# Patient Record
Sex: Male | Born: 1985
Health system: Southern US, Community
[De-identification: ages and names within clinical notes are randomized; demographics above are authoritative.]

## PROBLEM LIST (undated history)

## (undated) DIAGNOSIS — D649 Anemia, unspecified: Secondary | ICD-10-CM

## (undated) DIAGNOSIS — T7840XA Allergy, unspecified, initial encounter: Secondary | ICD-10-CM

## (undated) DIAGNOSIS — G709 Myoneural disorder, unspecified: Secondary | ICD-10-CM

## (undated) DIAGNOSIS — J45909 Unspecified asthma, uncomplicated: Secondary | ICD-10-CM

## (undated) DIAGNOSIS — K219 Gastro-esophageal reflux disease without esophagitis: Secondary | ICD-10-CM

## (undated) HISTORY — PX: UPPER GASTROINTESTINAL ENDOSCOPY: SHX188

## (undated) HISTORY — PX: COLONOSCOPY: SHX174

## (undated) HISTORY — PX: MYRINGOTOMY: SUR874

## (undated) HISTORY — PX: POLYPECTOMY: SHX149

## (undated) HISTORY — DX: Anemia, unspecified: D64.9

## (undated) HISTORY — DX: Myoneural disorder, unspecified: G70.9

## (undated) HISTORY — DX: Unspecified asthma, uncomplicated: J45.909

## (undated) HISTORY — DX: Gastro-esophageal reflux disease without esophagitis: K21.9

## (undated) HISTORY — DX: Allergy, unspecified, initial encounter: T78.40XA

## (undated) HISTORY — PX: TYMPANOSTOMY TUBE PLACEMENT: SHX32

---

## 2014-05-09 ENCOUNTER — Other Ambulatory Visit (INDEPENDENT_AMBULATORY_CARE_PROVIDER_SITE_OTHER): Payer: Managed Care, Other (non HMO)

## 2014-05-09 ENCOUNTER — Ambulatory Visit (INDEPENDENT_AMBULATORY_CARE_PROVIDER_SITE_OTHER): Payer: Managed Care, Other (non HMO) | Admitting: Internal Medicine

## 2014-05-09 ENCOUNTER — Encounter: Payer: Self-pay | Admitting: Internal Medicine

## 2014-05-09 VITALS — BP 108/64 | HR 77 | Temp 98.4°F | Resp 14 | Ht 69.0 in | Wt 187.6 lb

## 2014-05-09 DIAGNOSIS — Z0000 Encounter for general adult medical examination without abnormal findings: Secondary | ICD-10-CM

## 2014-05-09 DIAGNOSIS — R7989 Other specified abnormal findings of blood chemistry: Secondary | ICD-10-CM

## 2014-05-09 DIAGNOSIS — Z23 Encounter for immunization: Secondary | ICD-10-CM

## 2014-05-09 NOTE — Patient Instructions (Signed)
We have given you the tetanus shot today.   We did not find any abnormalities on exam today and will check your lab work as well. We will call you back with those results.   You can work on squeezing the muscles in the anus and holding for about 20-30 seconds and doing 5 reps about 1-2 times per day to see if this helps. If you start having more problems or are still having the problems please call us back and we can send you to the GI doctor to get their opinion.   Otherwise you are in good health so keep up the good work.   Come back in 1-2 years for a physical. If you have any problems or questions sooner please feel free to call our office.   Health Maintenance A healthy lifestyle and preventative care can promote health and wellness.  Maintain regular health, dental, and eye exams.  Eat a healthy diet. Foods like vegetables, fruits, whole grains, low-fat dairy products, and lean protein foods contain the nutrients you need and are low in calories. Decrease your intake of foods high in solid fats, added sugars, and salt. Get information about a proper diet from your health care provider, if necessary.  Regular physical exercise is one of the most important things you can do for your health. Most adults should get at least 150 minutes of moderate-intensity exercise (any activity that increases your heart rate and causes you to sweat) each week. In addition, most adults need muscle-strengthening exercises on 2 or more days a week.   Maintain a healthy weight. The body mass index (BMI) is a screening tool to identify possible weight problems. It provides an estimate of body fat based on height and weight. Your health care provider can find your BMI and can help you achieve or maintain a healthy weight. For males 20 years and older:  A BMI below 18.5 is considered underweight.  A BMI of 18.5 to 24.9 is normal.  A BMI of 25 to 29.9 is considered overweight.  A BMI of 30 and above is  considered obese.  Maintain normal blood lipids and cholesterol by exercising and minimizing your intake of saturated fat. Eat a balanced diet with plenty of fruits and vegetables. Blood tests for lipids and cholesterol should begin at age 72 and be repeated every 5 years. If your lipid or cholesterol levels are high, you are over age 9, or you are at high risk for heart disease, you may need your cholesterol levels checked more frequently.Ongoing high lipid and cholesterol levels should be treated with medicines if diet and exercise are not working.  If you smoke, find out from your health care provider how to quit. If you do not use tobacco, do not start.  Lung cancer screening is recommended for adults aged 36-80 years who are at high risk for developing lung cancer because of a history of smoking. A yearly low-dose CT scan of the lungs is recommended for people who have at least a 30-pack-year history of smoking and are current smokers or have quit within the past 15 years. A pack year of smoking is smoking an average of 1 pack of cigarettes a day for 1 year (for example, a 30-pack-year history of smoking could mean smoking 1 pack a day for 30 years or 2 packs a day for 15 years). Yearly screening should continue until the smoker has stopped smoking for at least 15 years. Yearly screening should be stopped for  people who develop a health problem that would prevent them from having lung cancer treatment.  If you choose to drink alcohol, do not have more than 2 drinks per day. One drink is considered to be 12 oz (360 mL) of beer, 5 oz (150 mL) of wine, or 1.5 oz (45 mL) of liquor.  Avoid the use of street drugs. Do not share needles with anyone. Ask for help if you need support or instructions about stopping the use of drugs.  High blood pressure causes heart disease and increases the risk of stroke. Blood pressure should be checked at least every 1-2 years. Ongoing high blood pressure should be  treated with medicines if weight loss and exercise are not effective.  If you are 50-31 years old, ask your health care provider if you should take aspirin to prevent heart disease.  Diabetes screening involves taking a blood sample to check your fasting blood sugar level. This should be done once every 3 years after age 61 if you are at a normal weight and without risk factors for diabetes. Testing should be considered at a younger age or be carried out more frequently if you are overweight and have at least 1 risk factor for diabetes.  Colorectal cancer can be detected and often prevented. Most routine colorectal cancer screening begins at the age of 29 and continues through age 58. However, your health care provider may recommend screening at an earlier age if you have risk factors for colon cancer. On a yearly basis, your health care provider may provide home test kits to check for hidden blood in the stool. A small camera at the end of a tube may be used to directly examine the colon (sigmoidoscopy or colonoscopy) to detect the earliest forms of colorectal cancer. Talk to your health care provider about this at age 73 when routine screening begins. A direct exam of the colon should be repeated every 5-10 years through age 80, unless early forms of precancerous polyps or small growths are found.  People who are at an increased risk for hepatitis B should be screened for this virus. You are considered at high risk for hepatitis B if:  You were born in a country where hepatitis B occurs often. Talk with your health care provider about which countries are considered high risk.  Your parents were born in a high-risk country and you have not received a shot to protect against hepatitis B (hepatitis B vaccine).  You have HIV or AIDS.  You use needles to inject street drugs.  You live with, or have sex with, someone who has hepatitis B.  You are a man who has sex with other men (MSM).  You get  hemodialysis treatment.  You take certain medicines for conditions like cancer, organ transplantation, and autoimmune conditions.  Hepatitis C blood testing is recommended for all people born from [birth year] through 1965 and any individual with known risk factors for hepatitis C.  Healthy men should no longer receive prostate-specific antigen (PSA) blood tests as part of routine cancer screening. Talk to your health care provider about prostate cancer screening.  Testicular cancer screening is not recommended for adolescents or adult males who have no symptoms. Screening includes self-exam, a health care provider exam, and other screening tests. Consult with your health care provider about any symptoms you have or any concerns you have about testicular cancer.  Practice safe sex. Use condoms and avoid high-risk sexual practices to reduce the spread of sexually transmitted  infections (STIs).  You should be screened for STIs, including gonorrhea and chlamydia if:  You are sexually active and are younger than 24 years.  You are older than 24 years, and your health care provider tells you that you are at risk for this type of infection.  Your sexual activity has changed since you were last screened, and you are at an increased risk for chlamydia or gonorrhea. Ask your health care provider if you are at risk.  If you are at risk of being infected with HIV, it is recommended that you take a prescription medicine daily to prevent HIV infection. This is called pre-exposure prophylaxis (PrEP). You are considered at risk if:  You are a man who has sex with other men (MSM).  You are a heterosexual man who is sexually active with multiple partners.  You take drugs by injection.  You are sexually active with a partner who has HIV.  Talk with your health care provider about whether you are at high risk of being infected with HIV. If you choose to begin PrEP, you should first be tested for HIV. You should  then be tested every 3 months for as long as you are taking PrEP.  Use sunscreen. Apply sunscreen liberally and repeatedly throughout the day. You should seek shade when your shadow is shorter than you. Protect yourself by wearing long sleeves, pants, a wide-brimmed hat, and sunglasses year round whenever you are outdoors.  Tell your health care provider of new moles or changes in moles, especially if there is a change in shape or color. Also, tell your health care provider if a mole is larger than the size of a pencil eraser.  A one-time screening for abdominal aortic aneurysm (AAA) and surgical repair of large AAAs by ultrasound is recommended for men aged 30-75 years who are current or former smokers.  Stay current with your vaccines (immunizations). Document Released: 08/31/2007 Document Revised: 03/09/2013 Document Reviewed: 07/30/2010 [facility] Patient Information 2015 [place], [place]. This information is not intended to replace advice given to you by your health care provider. Make sure you discuss any questions you have with your health care provider.

## 2014-05-09 NOTE — Progress Notes (Signed)
Pre visit review using our clinic review tool, if applicable. No additional management support is needed unless otherwise documented below in the visit note. 

## 2014-05-10 LAB — LIPID PANEL
CHOL/HDL RATIO: 4
Cholesterol: 161 mg/dL (ref 0–200)
HDL: 40.6 mg/dL (ref >39.00)
NonHDL: 120.4
Triglycerides: 263 mg/dL — ABNORMAL HIGH (ref 0.0–149.0)
VLDL: 52.6 mg/dL — AB (ref 0.0–40.0)

## 2014-05-10 LAB — COMPREHENSIVE METABOLIC PANEL
ALT: 62 U/L — ABNORMAL HIGH (ref 0–53)
AST: 27 U/L (ref 0–37)
Albumin: 4.5 g/dL (ref 3.5–5.2)
Alkaline Phosphatase: 103 U/L (ref 39–117)
BILIRUBIN TOTAL: 0.4 mg/dL (ref 0.2–1.2)
BUN: 13 mg/dL (ref 6–23)
CHLORIDE: 105 meq/L (ref 96–112)
CO2: 24 meq/L (ref 19–32)
Calcium: 9.4 mg/dL (ref 8.4–10.5)
Creatinine, Ser: 0.98 mg/dL (ref 0.40–1.50)
GFR: 96.23 mL/min (ref >60.00)
Glucose, Bld: 74 mg/dL (ref 70–99)
POTASSIUM: 4.2 meq/L (ref 3.5–5.1)
Sodium: 141 mEq/L (ref 135–145)
TOTAL PROTEIN: 7.5 g/dL (ref 6.0–8.3)

## 2014-05-10 LAB — LDL CHOLESTEROL, DIRECT: Direct LDL: 87 mg/dL

## 2014-05-12 ENCOUNTER — Encounter: Payer: Self-pay | Admitting: Internal Medicine

## 2014-05-12 DIAGNOSIS — Z0000 Encounter for general adult medical examination without abnormal findings: Secondary | ICD-10-CM | POA: Insufficient documentation

## 2014-05-12 NOTE — Assessment & Plan Note (Signed)
Check screening lab work today. Non-smoker, exercises. Tdap given today, declines flu shot.

## 2014-05-12 NOTE — Progress Notes (Signed)
   Subjective:    Patient ID: Dustin Warren, male    DOB: 24-May-1985, 29 y.o.   MRN: [id number]  HPI The patient is a 29 YO man coming in for wellness. He also has been having some trouble with his anus over the last year. When his bowel movements are loose he finds it hard to use his muscles to stop the bowel movement and some is left hanging. He denies incontinence, leakage, sensation problem, hx of anal penetration ever. He is married with several kids. No other complaints. Exercises regularly and non-smoker.   PMH, [facility], social history, allergies, medications, problem list reviewed and updated.   Review of Systems  Constitutional: Negative for fever, activity change, appetite change, fatigue and unexpected weight change.  HENT: Negative.   Eyes: Negative.   Respiratory: Negative for cough, chest tightness, shortness of breath and wheezing.   Cardiovascular: Negative for chest pain, palpitations and leg swelling.  Gastrointestinal: Negative for abdominal pain, diarrhea, constipation, blood in stool, abdominal distention, anal bleeding and rectal pain.  Endocrine: Negative.   Genitourinary: Negative.   Musculoskeletal: Negative.   Skin: Negative.   Neurological: Negative.   Psychiatric/Behavioral: Negative.       Objective:   Physical Exam  Constitutional: He is oriented to person, place, and time. He appears well-developed and well-nourished.  HENT:  Head: Normocephalic and atraumatic.  Eyes: EOM are normal.  Neck: Normal range of motion.  Cardiovascular: Normal rate and regular rhythm.   Pulmonary/Chest: Effort normal and breath sounds normal. No respiratory distress. He has no wheezes. He has no rales.  Abdominal: Soft. Bowel sounds are normal. He exhibits no distension. There is no tenderness. There is no rebound.  Genitourinary: Rectum normal. Guaiac negative stool.  No hemorrhoids or other tag or abnormality externally or internally with his anus, hemoccult negative    Neurological: He is alert and oriented to person, place, and time.  Skin: Skin is warm and dry.  Psychiatric: He has a normal mood and affect. His behavior is normal.   Filed Vitals:   05/09/14 1110  BP: 108/64  Pulse: 77  Temp: 98.4 F (36.9 C)  TempSrc: Oral  Resp: 14  Height: 5\' 9"  (1.753 m)  Weight: 187 lb 9.6 oz (85.095 kg)  SpO2: 99%      Assessment & Plan:  Anal urge: Offered muscle training with exercises. If no improvement or worsening will refer to GI.   Tdap given today.

## 2015-09-08 ENCOUNTER — Other Ambulatory Visit (INDEPENDENT_AMBULATORY_CARE_PROVIDER_SITE_OTHER): Payer: Commercial Managed Care - HMO

## 2015-09-08 ENCOUNTER — Ambulatory Visit (INDEPENDENT_AMBULATORY_CARE_PROVIDER_SITE_OTHER): Payer: Commercial Managed Care - HMO | Admitting: Internal Medicine

## 2015-09-08 ENCOUNTER — Encounter: Payer: Self-pay | Admitting: Internal Medicine

## 2015-09-08 VITALS — BP 106/62 | HR 78 | Temp 98.0°F | Resp 16 | Ht 69.0 in | Wt 194.0 lb

## 2015-09-08 DIAGNOSIS — Z0000 Encounter for general adult medical examination without abnormal findings: Secondary | ICD-10-CM

## 2015-09-08 LAB — COMPREHENSIVE METABOLIC PANEL
ALT: 68 U/L — ABNORMAL HIGH (ref 0–53)
AST: 28 U/L (ref 0–37)
Albumin: 4.5 g/dL (ref 3.5–5.2)
Alkaline Phosphatase: 93 U/L (ref 39–117)
BUN: 14 mg/dL (ref 6–23)
CALCIUM: 9.3 mg/dL (ref 8.4–10.5)
CHLORIDE: 105 meq/L (ref 96–112)
CO2: 31 mEq/L (ref 19–32)
CREATININE: 0.96 mg/dL (ref 0.40–1.50)
GFR: 97.65 mL/min (ref >60.00)
Glucose, Bld: 70 mg/dL (ref 70–99)
Potassium: 4.6 mEq/L (ref 3.5–5.1)
Sodium: 140 mEq/L (ref 135–145)
Total Bilirubin: 0.6 mg/dL (ref 0.2–1.2)
Total Protein: 7 g/dL (ref 6.0–8.3)

## 2015-09-08 NOTE — Patient Instructions (Signed)
We will recheck the labs today and call you back with the results.   Keep up the good work with exercising, this is great for your health long term.   Health Maintenance, Male A healthy lifestyle and preventative care can promote health and wellness.  Maintain regular health, dental, and eye exams.  Eat a healthy diet. Foods like vegetables, fruits, whole grains, low-fat dairy products, and lean protein foods contain the nutrients you need and are low in calories. Decrease your intake of foods high in solid fats, added sugars, and salt. Get information about a proper diet from your health care provider, if necessary.  Regular physical exercise is one of the most important things you can do for your health. Most adults should get at least 150 minutes of moderate-intensity exercise (any activity that increases your heart rate and causes you to sweat) each week. In addition, most adults need muscle-strengthening exercises on 2 or more days a week.   Maintain a healthy weight. The body mass index (BMI) is a screening tool to identify possible weight problems. It provides an estimate of body fat based on height and weight. Your health care provider can find your BMI and can help you achieve or maintain a healthy weight. For males 20 years and older:  A BMI below 18.5 is considered underweight.  A BMI of 18.5 to 24.9 is normal.  A BMI of 25 to 29.9 is considered overweight.  A BMI of 30 and above is considered obese.  Maintain normal blood lipids and cholesterol by exercising and minimizing your intake of saturated fat. Eat a balanced diet with plenty of fruits and vegetables. Blood tests for lipids and cholesterol should begin at age 80 and be repeated every 5 years. If your lipid or cholesterol levels are high, you are over age 18, or you are at high risk for heart disease, you may need your cholesterol levels checked more frequently.Ongoing high lipid and cholesterol levels should be treated  with medicines if diet and exercise are not working.  If you smoke, find out from your health care provider how to quit. If you do not use tobacco, do not start.  Lung cancer screening is recommended for adults aged 37-80 years who are at high risk for developing lung cancer because of a history of smoking. A yearly low-dose CT scan of the lungs is recommended for people who have at least a 30-pack-year history of smoking and are current smokers or have quit within the past 15 years. A pack year of smoking is smoking an average of 1 pack of cigarettes a day for 1 year (for example, a 30-pack-year history of smoking could mean smoking 1 pack a day for 30 years or 2 packs a day for 15 years). Yearly screening should continue until the smoker has stopped smoking for at least 15 years. Yearly screening should be stopped for people who develop a health problem that would prevent them from having lung cancer treatment.  If you choose to drink alcohol, do not have more than 2 drinks per day. One drink is considered to be 12 oz (360 mL) of beer, 5 oz (150 mL) of wine, or 1.5 oz (45 mL) of liquor.  Avoid the use of street drugs. Do not share needles with anyone. Ask for help if you need support or instructions about stopping the use of drugs.  High blood pressure causes heart disease and increases the risk of stroke. High blood pressure is more likely to  develop in:  People who have blood pressure in the end of the normal range (100-139/85-89 mm Hg).  People who are overweight or obese.  People who are African American.  If you are 57-35 years of age, have your blood pressure checked every 3-5 years. If you are 44 years of age or older, have your blood pressure checked every year. You should have your blood pressure measured twice--once when you are at a hospital or clinic, and once when you are not at a hospital or clinic. Record the average of the two measurements. To check your blood pressure when you  are not at a hospital or clinic, you can use:  An automated blood pressure machine at a pharmacy.  A home blood pressure monitor.  If you are 60-74 years old, ask your health care provider if you should take aspirin to prevent heart disease.  Diabetes screening involves taking a blood sample to check your fasting blood sugar level. This should be done once every 3 years after age 90 if you are at a normal weight and without risk factors for diabetes. Testing should be considered at a younger age or be carried out more frequently if you are overweight and have at least 1 risk factor for diabetes.  Colorectal cancer can be detected and often prevented. Most routine colorectal cancer screening begins at the age of 18 and continues through age 79. However, your health care provider may recommend screening at an earlier age if you have risk factors for colon cancer. On a yearly basis, your health care provider may provide home test kits to check for hidden blood in the stool. A small camera at the end of a tube may be used to directly examine the colon (sigmoidoscopy or colonoscopy) to detect the earliest forms of colorectal cancer. Talk to your health care provider about this at age 77 when routine screening begins. A direct exam of the colon should be repeated every 5-10 years through age 52, unless early forms of precancerous polyps or small growths are found.  People who are at an increased risk for hepatitis B should be screened for this virus. You are considered at high risk for hepatitis B if:  You were born in a country where hepatitis B occurs often. Talk with your health care provider about which countries are considered high risk.  Your parents were born in a high-risk country and you have not received a shot to protect against hepatitis B (hepatitis B vaccine).  You have HIV or AIDS.  You use needles to inject street drugs.  You live with, or have sex with, someone who has hepatitis  B.  You are a man who has sex with other men (MSM).  You get hemodialysis treatment.  You take certain medicines for conditions like cancer, organ transplantation, and autoimmune conditions.  Hepatitis C blood testing is recommended for all people born from 18 through 1965 and any individual with known risk factors for hepatitis C.  Healthy men should no longer receive prostate-specific antigen (PSA) blood tests as part of routine cancer screening. Talk to your health care provider about prostate cancer screening.  Testicular cancer screening is not recommended for adolescents or adult males who have no symptoms. Screening includes self-exam, a health care provider exam, and other screening tests. Consult with your health care provider about any symptoms you have or any concerns you have about testicular cancer.  Practice safe sex. Use condoms and avoid high-risk sexual practices to reduce  the spread of sexually transmitted infections (STIs).  You should be screened for STIs, including gonorrhea and chlamydia if:  You are sexually active and are younger than 24 years.  You are older than 24 years, and your health care provider tells you that you are at risk for this type of infection.  Your sexual activity has changed since you were last screened, and you are at an increased risk for chlamydia or gonorrhea. Ask your health care provider if you are at risk.  If you are at risk of being infected with HIV, it is recommended that you take a prescription medicine daily to prevent HIV infection. This is called pre-exposure prophylaxis (PrEP). You are considered at risk if:  You are a man who has sex with other men (MSM).  You are a heterosexual man who is sexually active with multiple partners.  You take drugs by injection.  You are sexually active with a partner who has HIV.  Talk with your health care provider about whether you are at high risk of being infected with HIV. If you  choose to begin PrEP, you should first be tested for HIV. You should then be tested every 3 months for as long as you are taking PrEP.  Use sunscreen. Apply sunscreen liberally and repeatedly throughout the day. You should seek shade when your shadow is shorter than you. Protect yourself by wearing long sleeves, pants, a wide-brimmed hat, and sunglasses year round whenever you are outdoors.  Tell your health care provider of new moles or changes in moles, especially if there is a change in shape or color. Also, tell your health care provider if a mole is larger than the size of a pencil eraser.  A one-time screening for abdominal aortic aneurysm (AAA) and surgical repair of large AAAs by ultrasound is recommended for men aged 48-75 years who are current or former smokers.  Stay current with your vaccines (immunizations).   This information is not intended to replace advice given to you by your health care provider. Make sure you discuss any questions you have with your health care provider.   Document Released: 08/31/2007 Document Revised: 03/25/2014 Document Reviewed: 07/30/2010 Elsevier Interactive Patient Education [employer].

## 2015-09-08 NOTE — Progress Notes (Signed)
Pre visit review using our clinic review tool, if applicable. No additional management support is needed unless otherwise documented below in the visit note. 

## 2015-09-08 NOTE — Progress Notes (Signed)
   Subjective:    Patient ID: Dustin Warren, male    DOB: 1986/02/04, 30 y.o.   MRN: [id number]  HPI The patient is a 30 YO man coming in for wellness. No new concerns.   PMH, [facility], social history reviewed and updated.   Review of Systems  Constitutional: Negative for fever, activity change, appetite change, fatigue and unexpected weight change.  HENT: Negative.   Eyes: Negative.   Respiratory: Negative for cough, chest tightness, shortness of breath and wheezing.   Cardiovascular: Negative for chest pain, palpitations and leg swelling.  Gastrointestinal: Negative for abdominal pain, diarrhea, constipation, blood in stool, abdominal distention, anal bleeding and rectal pain.  Endocrine: Negative.   Genitourinary: Negative.   Musculoskeletal: Negative.   Skin: Negative.   Neurological: Negative.   Psychiatric/Behavioral: Negative.       Objective:   Physical Exam  Constitutional: He is oriented to person, place, and time. He appears well-developed and well-nourished.  HENT:  Head: Normocephalic and atraumatic.  Eyes: EOM are normal.  Neck: Normal range of motion.  Cardiovascular: Normal rate and regular rhythm.   Pulmonary/Chest: Effort normal and breath sounds normal. No respiratory distress. He has no wheezes. He has no rales.  Abdominal: Soft. Bowel sounds are normal. He exhibits no distension. There is no tenderness. There is no rebound.  Neurological: He is alert and oriented to person, place, and time.  Skin: Skin is warm and dry.  Psychiatric: He has a normal mood and affect. His behavior is normal.   Filed Vitals:   09/08/15 1032  BP: 106/62  Pulse: 78  Temp: 98 F (36.7 C)  TempSrc: Oral  Resp: 16  Height: 5\' 9"  (1.753 m)  Weight: 194 lb (87.998 kg)  SpO2: 98%      Assessment & Plan:

## 2015-09-08 NOTE — Assessment & Plan Note (Signed)
Checking labs, he is non-smoker and exercising 4 times per week. Counseled about distracted driving and other wellness. Given screening recommendations.

## 2016-09-12 ENCOUNTER — Other Ambulatory Visit (INDEPENDENT_AMBULATORY_CARE_PROVIDER_SITE_OTHER): Payer: Commercial Managed Care - HMO

## 2016-09-12 ENCOUNTER — Other Ambulatory Visit: Payer: Self-pay | Admitting: Internal Medicine

## 2016-09-12 ENCOUNTER — Encounter: Payer: Self-pay | Admitting: Internal Medicine

## 2016-09-12 ENCOUNTER — Ambulatory Visit (INDEPENDENT_AMBULATORY_CARE_PROVIDER_SITE_OTHER): Payer: 59 | Admitting: Internal Medicine

## 2016-09-12 DIAGNOSIS — A6920 Lyme disease, unspecified: Secondary | ICD-10-CM | POA: Insufficient documentation

## 2016-09-12 LAB — COMPREHENSIVE METABOLIC PANEL
ALT: 186 U/L — ABNORMAL HIGH (ref 0–53)
AST: 80 U/L — ABNORMAL HIGH (ref 0–37)
Albumin: 4.7 g/dL (ref 3.5–5.2)
Alkaline Phosphatase: 115 U/L (ref 39–117)
BUN: 9 mg/dL (ref 6–23)
CALCIUM: 9.5 mg/dL (ref 8.4–10.5)
CHLORIDE: 103 meq/L (ref 96–112)
CO2: 28 meq/L (ref 19–32)
CREATININE: 1.06 mg/dL (ref 0.40–1.50)
GFR: 86.51 mL/min (ref >60.00)
GLUCOSE: 94 mg/dL (ref 70–99)
Potassium: 4.1 mEq/L (ref 3.5–5.1)
Sodium: 140 mEq/L (ref 135–145)
Total Bilirubin: 0.5 mg/dL (ref 0.2–1.2)
Total Protein: 7.2 g/dL (ref 6.0–8.3)

## 2016-09-12 MED ORDER — DOXYCYCLINE HYCLATE 100 MG PO TABS: 100.0000 mg | ORAL_TABLET | Freq: Two times a day (BID) | ORAL | 0 refills | Status: DC

## 2016-09-12 NOTE — Patient Instructions (Signed)
Take the antibiotic for the full three weeks.   Have blood work done today.     Lyme Disease Lyme disease is an infection that affects many parts of the body, including the skin, joints, and nervous system. It is a bacterial infection that starts from the bite of an infected tick. The infection can spread, and some of the symptoms are similar to the flu. If Lyme disease is not treated, it may cause joint pain, swelling, numbness, problems thinking, fatigue, muscle weakness, and other problems. What are the causes? This condition is caused by bacteria called Borrelia burgdorferi. You can get Lyme disease by being bitten by an infected tick. The tick must be attached to your skin to pass along the infection. Deer often carry infected ticks. What increases the risk? The following factors may make you more likely to develop this condition:  Living in or visiting these areas in the [place] ? [place]. ? [place]. ? [place].  Spending time in wooded or grassy areas.  Being outdoors with exposed skin.  Camping, gardening, hiking, fishing, or hunting outdoors.  Failing to remove a tick from your skin within 3-4 days.  What are the signs or symptoms? Symptoms of this condition include:  A round, red rash that surrounds the center of the tick bite. This is the first sign of infection. The center of the rash may be blood colored or have tiny blisters.  Fatigue.  Headache.  Chills and fever.  General achiness.  Joint pain, often in the knees.  Muscle pain.  Swollen lymph glands.  Stiff neck.  How is this diagnosed? This condition is diagnosed based on:  Your symptoms and medical history.  A physical exam.  A blood test.  How is this treated? The main treatment for this condition is antibiotic medicine, which is usually taken by mouth (orally). The length of treatment depends on how soon after a tick bite you begin taking the medicine. In some  cases, treatment is necessary for several weeks. If the infection is severe, antibiotics may need to be given through an IV tube that is inserted into one of your veins. Follow these instructions at home:  Take your antibiotic medicine as told by your health care provider. Do not stop taking the antibiotic even if you start to feel better.  Ask your health care provider about takinga probiotic in between doses of your antibiotic to help avoid stomach upset or diarrhea.  Check with your health care provider before supplementing your treatment. Many alternative therapies have not been proven and may be harmful to you.  Keep all follow-up visits as told by your health care provider. This is important. How is this prevented? You can become reinfected if you get another tick bite from an infected tick. Take these steps to help prevent an infection:  Cover your skin with light-colored clothing when you are outdoors in the spring and summer months.  Spray clothing and skin with bug spray. The spray should be 20-30% DEET.  Avoid wooded, grassy, and shaded areas.  Remove yard litter, brush, trash, and plants that attract deer and rodents.  Check yourself for ticks when you come indoors.  Wash clothing worn each day.  Check your pets for ticks before they come inside.  If you find a tick: ? Remove it with tweezers. ? Clean your hands and the bite area with rubbing alcohol or soap and water.  Pregnant women should take special care to avoid  tick bites because the infection can be passed along to the fetus. Contact a health care provider if:  You have symptoms after treatment.  You have removed a tick and want to bring it to your health care provider for testing. Get help right away if:  You have an irregular heartbeat.  You have nerve pain.  Your face feels numb. This information is not intended to replace advice given to you by your health care provider. Make sure you discuss any  questions you have with your health care provider. Document Released: 06/10/2000 Document Revised: 10/24/2015 Document Reviewed: 10/24/2015 Elsevier Interactive Patient Education  2017 [employer].

## 2016-09-12 NOTE — Progress Notes (Signed)
Subjective:    Patient ID: Dustin Warren, male    DOB: Oct 24, 1985, 31 y.o.   MRN: [id number]  HPI He is here for an acute visit.   Tick bite:  He took a tick off his back 1-1.5 weeks ago.  The tick was all brown.  The area was irritated and his wife noticed a red rash around the tick bite yesterday.  It does not hurt or itch, but is just irritated.  He does have some fatigue, but he works outside and that is not unusual - he is unsure if it is more.  He had chills on one occasion, but denies fever. He had a headache once last week, which is unusual.  He has some mild joint pain and is unsure if it is worse.    Medications and allergies reviewed with patient and updated if appropriate.  Patient Active Problem List   Diagnosis Date Noted  . Routine general medical examination at a health care facility 05/12/2014    No current outpatient prescriptions on file prior to visit.   No current facility-administered medications on file prior to visit.     Past Medical History:  Diagnosis Date  . Asthma     No past surgical history on file.  Social History   Social History  . Marital status: Married    Spouse name: N/A  . Number of children: N/A  . Years of education: N/A   Social History Main Topics  . Smoking status: Former Research scientist (life sciences)  . Smokeless tobacco: Not on file  . Alcohol use No  . Drug use: No  . Sexual activity: Not on file   Other Topics Concern  . Not on file   Social History Narrative  . No narrative on file    Family History  Problem Relation Age of Onset  . Arthritis Mother   . Hyperlipidemia Mother   . Hypertension Mother   . Diabetes Mother   . Birth defects Maternal Grandmother        breast  . Arthritis Maternal Grandfather   . Heart disease Maternal Grandfather   . Stroke Paternal Grandfather     Review of Systems  Constitutional: Positive for chills (felt chilled one night) and fatigue (? a little more than normal). Negative for fever.    Musculoskeletal: Positive for arthralgias (mild - ? worse than usual). Negative for back pain, joint swelling and myalgias.  Skin: Positive for rash (on back - near tick bite).  Neurological: Positive for headaches (one headache and never gets headaches - last week - resolved with 2 advil). Negative for dizziness and light-headedness.       Objective:   Vitals:   09/12/16 0941  BP: 126/76  Pulse: 83  Resp: 18  Temp: 98.2 F (36.8 C)   Filed Weights   09/12/16 0941  Weight: 191 lb (86.6 kg)   Body mass index is 28.21 kg/m.  Wt Readings from Last 3 Encounters:  09/12/16 191 lb (86.6 kg)  09/08/15 194 lb (88 kg)  05/09/14 187 lb 9.6 oz (85.1 kg)     Physical Exam  Constitutional: He appears well-developed and well-nourished. No distress.  HENT:  Head: Normocephalic and atraumatic.  Musculoskeletal: He exhibits no edema or deformity.  No joint swelling  Skin: Rash (circular macular papular rash on back with central lesion - tick bite, no swelling, no open wound) noted. He is not diaphoretic.  No other skin lesions or rashes  Assessment & Plan:   See Problem List for Assessment and Plan of chronic medical problems.

## 2016-09-12 NOTE — Assessment & Plan Note (Signed)
Rash consistent with erythema migrans Tick bite about one week ago without symptoms suggestive of active lyme Start doxycycline 100 mg BID x 3 weeks -  discussed possible side effects Monitor for symptoms Blood work today

## 2016-09-13 LAB — LYME AB/WESTERN BLOT REFLEX: B burgdorferi Ab IgG+IgM: <0.9 Index (ref <0.90)

## 2016-09-14 LAB — BABESIA MICROTI ANTIBODY PANEL: Babesia microti IgG: <1:10 {titer}

## 2016-09-15 LAB — EHRLICHIA ANTIBODY PANEL: E chaffeensis (HGE) Ab, IgM: <1:20 {titer}

## 2016-09-16 ENCOUNTER — Other Ambulatory Visit: Payer: Self-pay | Admitting: Internal Medicine

## 2016-09-16 DIAGNOSIS — R7989 Other specified abnormal findings of blood chemistry: Secondary | ICD-10-CM

## 2016-09-16 DIAGNOSIS — R945 Abnormal results of liver function studies: Principal | ICD-10-CM

## 2016-09-27 ENCOUNTER — Ambulatory Visit (INDEPENDENT_AMBULATORY_CARE_PROVIDER_SITE_OTHER): Payer: 59 | Admitting: Nurse Practitioner

## 2016-09-27 ENCOUNTER — Other Ambulatory Visit (INDEPENDENT_AMBULATORY_CARE_PROVIDER_SITE_OTHER): Payer: 59

## 2016-09-27 ENCOUNTER — Encounter: Payer: Self-pay | Admitting: Nurse Practitioner

## 2016-09-27 VITALS — BP 116/70 | HR 72 | Temp 98.2°F | Ht 69.0 in | Wt 195.0 lb

## 2016-09-27 DIAGNOSIS — D649 Anemia, unspecified: Secondary | ICD-10-CM | POA: Diagnosis not present

## 2016-09-27 DIAGNOSIS — M25561 Pain in right knee: Secondary | ICD-10-CM | POA: Insufficient documentation

## 2016-09-27 DIAGNOSIS — Z800 Family history of malignant neoplasm of digestive organs: Secondary | ICD-10-CM

## 2016-09-27 DIAGNOSIS — Z13220 Encounter for screening for lipoid disorders: Secondary | ICD-10-CM

## 2016-09-27 DIAGNOSIS — R7989 Other specified abnormal findings of blood chemistry: Secondary | ICD-10-CM | POA: Diagnosis not present

## 2016-09-27 DIAGNOSIS — Z136 Encounter for screening for cardiovascular disorders: Secondary | ICD-10-CM

## 2016-09-27 DIAGNOSIS — Z0000 Encounter for general adult medical examination without abnormal findings: Secondary | ICD-10-CM | POA: Diagnosis not present

## 2016-09-27 DIAGNOSIS — Z0001 Encounter for general adult medical examination with abnormal findings: Secondary | ICD-10-CM

## 2016-09-27 LAB — COMPREHENSIVE METABOLIC PANEL
ALT: 59 U/L — AB (ref 0–53)
AST: 23 U/L (ref 0–37)
Albumin: 4.2 g/dL (ref 3.5–5.2)
Alkaline Phosphatase: 71 U/L (ref 39–117)
BILIRUBIN TOTAL: 0.3 mg/dL (ref 0.2–1.2)
BUN: 19 mg/dL (ref 6–23)
CHLORIDE: 102 meq/L (ref 96–112)
CO2: 28 meq/L (ref 19–32)
CREATININE: 0.92 mg/dL (ref 0.40–1.50)
Calcium: 9 mg/dL (ref 8.4–10.5)
GFR: 101.85 mL/min (ref >60.00)
GLUCOSE: 95 mg/dL (ref 70–99)
Potassium: 4.2 mEq/L (ref 3.5–5.1)
SODIUM: 138 meq/L (ref 135–145)
Total Protein: 6.5 g/dL (ref 6.0–8.3)

## 2016-09-27 LAB — CBC
HCT: 32.2 % — ABNORMAL LOW (ref 39.0–52.0)
Hemoglobin: 11.4 g/dL — ABNORMAL LOW (ref 13.0–17.0)
MCHC: 35.4 g/dL (ref 30.0–36.0)
MCV: 86.2 fl (ref 78.0–100.0)
Platelets: 244 10*3/uL (ref 150.0–400.0)
RBC: 3.74 Mil/uL — ABNORMAL LOW (ref 4.22–5.81)
RDW: 13 % (ref 11.5–15.5)
WBC: 5.8 10*3/uL (ref 4.0–10.5)

## 2016-09-27 LAB — TSH: TSH: 3.4 u[IU]/mL (ref 0.35–4.50)

## 2016-09-27 LAB — LIPID PANEL
CHOL/HDL RATIO: 4
Cholesterol: 169 mg/dL (ref 0–200)
HDL: 42.7 mg/dL (ref >39.00)
NONHDL: 126.09
TRIGLYCERIDES: 229 mg/dL — AB (ref 0.0–149.0)
VLDL: 45.8 mg/dL — ABNORMAL HIGH (ref 0.0–40.0)

## 2016-09-27 LAB — LDL CHOLESTEROL, DIRECT: Direct LDL: 89 mg/dL

## 2016-09-27 NOTE — Patient Instructions (Addendum)
Unexplained anemia: repeat cbc in 31month. With FHx of colon cancer; Possibly repeat IFOB and/or referral to GI if persistent.  Use knee sleeve for 2weeks then stop. Also use ibuprofen as needed for pain.  Health Maintenance, Male A healthy lifestyle and preventive care is important for your health and wellness. Ask your health care provider about what schedule of regular examinations is right for you. What should I know about weight and diet? Eat a Healthy Diet  Eat plenty of vegetables, fruits, whole grains, low-fat dairy products, and lean protein.  Do not eat a lot of foods high in solid fats, added sugars, or salt.  Maintain a Healthy Weight Regular exercise can help you achieve or maintain a healthy weight. You should:  Do at least 150 minutes of exercise each week. The exercise should increase your heart rate and make you sweat (moderate-intensity exercise).  Do strength-training exercises at least twice a week.  Watch Your Levels of Cholesterol and Blood Lipids  Have your blood tested for lipids and cholesterol every 5 years starting at 31 years of age. If you are at high risk for heart disease, you should start having your blood tested when you are 31 years old. You may need to have your cholesterol levels checked more often if: ? Your lipid or cholesterol levels are high. ? You are older than 31 years of age. ? You are at high risk for heart disease.  What should I know about cancer screening? Many types of cancers can be detected early and may often be prevented. Lung Cancer  You should be screened every year for lung cancer if: ? You are a current smoker who has smoked for at least 30 years. ? You are a former smoker who has quit within the past 15 years.  Talk to your health care provider about your screening options, when you should start screening, and how often you should be screened.  Colorectal Cancer  Routine colorectal cancer screening usually begins at 31  years of age and should be repeated every 5-10 years until you are 31 years old. You may need to be screened more often if early forms of precancerous polyps or small growths are found. Your health care provider may recommend screening at an earlier age if you have risk factors for colon cancer.  Your health care provider may recommend using home test kits to check for hidden blood in the stool.  A small camera at the end of a tube can be used to examine your colon (sigmoidoscopy or colonoscopy). This checks for the earliest forms of colorectal cancer.  Prostate and Testicular Cancer  Depending on your age and overall health, your health care provider may do certain tests to screen for prostate and testicular cancer.  Talk to your health care provider about any symptoms or concerns you have about testicular or prostate cancer.  Skin Cancer  Check your skin from head to toe regularly.  Tell your health care provider about any new moles or changes in moles, especially if: ? There is a change in a mole's size, shape, or color. ? You have a mole that is larger than a pencil eraser.  Always use sunscreen. Apply sunscreen liberally and repeat throughout the day.  Protect yourself by wearing long sleeves, pants, a wide-brimmed hat, and sunglasses when outside.  What should I know about heart disease, diabetes, and high blood pressure?  If you are 90-92 years of age, have your blood pressure checked every 3-5  years. If you are 89 years of age or older, have your blood pressure checked every year. You should have your blood pressure measured twice-once when you are at a hospital or clinic, and once when you are not at a hospital or clinic. Record the average of the two measurements. To check your blood pressure when you are not at a hospital or clinic, you can use: ? An automated blood pressure machine at a pharmacy. ? A home blood pressure monitor.  Talk to your health care provider about your  target blood pressure.  If you are between 80-22 years old, ask your health care provider if you should take aspirin to prevent heart disease.  Have regular diabetes screenings by checking your fasting blood sugar level. ? If you are at a normal weight and have a low risk for diabetes, have this test once every three years after the age of 71. ? If you are overweight and have a high risk for diabetes, consider being tested at a younger age or more often.  A one-time screening for abdominal aortic aneurysm (AAA) by ultrasound is recommended for men aged 24-75 years who are current or former smokers. What should I know about preventing infection? Hepatitis B If you have a higher risk for hepatitis B, you should be screened for this virus. Talk with your health care provider to find out if you are at risk for hepatitis B infection. Hepatitis C Blood testing is recommended for:  Everyone born from [birth year] through 1965.  Anyone with known risk factors for hepatitis C.  Sexually Transmitted Diseases (STDs)  You should be screened each year for STDs including gonorrhea and chlamydia if: ? You are sexually active and are younger than 31 years of age. ? You are older than 31 years of age and your health care provider tells you that you are at risk for this type of infection. ? Your sexual activity has changed since you were last screened and you are at an increased risk for chlamydia or gonorrhea. Ask your health care provider if you are at risk.  Talk with your health care provider about whether you are at high risk of being infected with HIV. Your health care provider may recommend a prescription medicine to help prevent HIV infection.  What else can I do?  Schedule regular health, dental, and eye exams.  Stay current with your vaccines (immunizations).  Do not use any tobacco products, such as cigarettes, chewing tobacco, and e-cigarettes. If you need help quitting, ask your health care  provider.  Limit alcohol intake to no more than 2 drinks per day. One drink equals 12 ounces of beer, 5 ounces of wine, or 1 ounces of hard liquor.  Do not use street drugs.  Do not share needles.  Ask your health care provider for help if you need support or information about quitting drugs.  Tell your health care provider if you often feel depressed.  Tell your health care provider if you have ever been abused or do not feel safe at home. This information is not intended to replace advice given to you by your health care provider. Make sure you discuss any questions you have with your health care provider. Document Released: 08/31/2007 Document Revised: 11/01/2015 Document Reviewed: 12/06/2014 Elsevier Interactive Patient Education  [employer].

## 2016-09-27 NOTE — Progress Notes (Signed)
Subjective:    Patient ID: Dustin Warren, male    DOB: 1985-07-17, 31 y.o.   MRN: [id number]  Patient presents today for complete physical  Knee Pain   The incident occurred 5 to 7 days ago. The incident occurred at home. The injury mechanism was a twisting injury and an eversion injury. The pain is present in the right knee. The quality of the pain is described as aching. The pain has been intermittent since onset. Pertinent negatives include no inability to bear weight, loss of motion, loss of sensation, muscle weakness, numbness or tingling. The symptoms are aggravated by weight bearing. He has tried nothing for the symptoms.   Immunizations: (TDAP, Hep C screen, Pneumovax, Influenza, zoster)  Health Maintenance  Topic Date Due  . HIV Screening  09/07/2020*  . Flu Shot  10/16/2016  . Tetanus Vaccine  05/09/2024  *Topic was postponed. The date shown is not the original due date.   Diet: regular Weight:  Wt Readings from Last 3 Encounters:  09/27/16 195 lb (88.5 kg)  09/12/16 191 lb (86.6 kg)  09/08/15 194 lb (88 kg)   Exercise:stays busy at work Engineer, materials).  Fall Risk: Fall Risk  09/27/2016  Falls in the past year? No   Home Safety:home with wife and 3kids.  Depression/Suicide:denies Depression screen [facility] 2/9 09/27/2016  Decreased Interest 0  Down, Depressed, Hopeless 0  PHQ - 2 Score 0   Vision: up to date, annually  Dental:up to date, annually.  Sexual History (birth control, marital status, STD): married, sexually active,  Medications and allergies reviewed with patient and updated if appropriate.  Patient Active Problem List   Diagnosis Date Noted  . Anemia 09/27/2016  . Acute pain of right knee 09/27/2016  . Erythema migrans (Lyme disease) 09/12/2016  . Routine general medical examination at a health care facility 05/12/2014    Current Outpatient Prescriptions on File Prior to Visit  Medication Sig Dispense Refill  . doxycycline (VIBRA-TABS) 100 MG  tablet Take 1 tablet (100 mg total) by mouth 2 (two) times daily. 42 tablet 0   No current facility-administered medications on file prior to visit.     Past Medical History:  Diagnosis Date  . Asthma     No past surgical history on file.  Social History   Social History  . Marital status: Married    Spouse name: N/A  . Number of children: N/A  . Years of education: N/A   Social History Main Topics  . Smoking status: Former Research scientist (life sciences)  . Smokeless tobacco: Never Used  . Alcohol use No  . Drug use: No  . Sexual activity: Not on file   Other Topics Concern  . Not on file   Social History Narrative  . No narrative on file    Family History  Problem Relation Age of Onset  . Arthritis Mother        OA  . Hyperlipidemia Mother   . Hypertension Mother   . Diabetes Mother   . Cancer Maternal Grandmother        breast  . Heart disease Maternal Grandfather   . Arthritis Maternal Grandfather        rheumatoid  . Stroke Paternal Grandfather   . Arthritis Maternal Aunt   . Arthritis Maternal Uncle   . Cancer Paternal Grandmother 16       colon, oral        Review of Systems  Constitutional: Positive for malaise/fatigue. Negative for chills, diaphoresis, fever  and weight loss.  HENT: Negative for congestion and sore throat.   Eyes: Negative for blurred vision.       Negative for visual changes  Respiratory: Negative for cough and shortness of breath.   Cardiovascular: Negative for chest pain, palpitations and leg swelling.  Gastrointestinal: Negative for abdominal pain, blood in stool, constipation, diarrhea, heartburn, melena, nausea and vomiting.  Genitourinary: Negative for dysuria, frequency and urgency.  Musculoskeletal: Positive for joint pain. Negative for falls and myalgias.  Skin: Negative for itching and rash.  Neurological: Negative for dizziness, tingling, sensory change, weakness, numbness and headaches.  Endo/Heme/Allergies: Does not bruise/bleed easily.   Psychiatric/Behavioral: Negative for depression, substance abuse and suicidal ideas. The patient is not nervous/anxious and does not have insomnia.     Objective:   Vitals:   09/27/16 1031  BP: 116/70  Pulse: 72  Temp: 98.2 F (36.8 C)    Body mass index is 28.8 kg/m.   Physical Examination:  Physical Exam  Constitutional: He is oriented to person, place, and time and well-developed, well-nourished, and in no distress. No distress.  HENT:  Right Ear: External ear normal.  Left Ear: External ear normal.  Nose: Nose normal.  Mouth/Throat: Oropharynx is clear and moist. No oropharyngeal exudate.  Eyes: Pupils are equal, round, and reactive to light. Conjunctivae and EOM are normal. No scleral icterus.  Neck: Normal range of motion. Neck supple. No thyromegaly present.  Cardiovascular: Normal rate, regular rhythm, normal heart sounds and intact distal pulses.   Pulmonary/Chest: Effort normal and breath sounds normal. He exhibits no tenderness.  Abdominal: Soft. Bowel sounds are normal. He exhibits no distension. There is no tenderness.  Genitourinary: Prostate normal. Rectal exam shows guaiac negative stool.  Musculoskeletal: Normal range of motion. He exhibits no edema or tenderness.       Right hip: Normal.       Right knee: Normal.       Right ankle: Normal.       Lumbar back: Normal.  Lymphadenopathy:    He has no cervical adenopathy.  Neurological: He is alert and oriented to person, place, and time. Gait normal.  Skin: Skin is warm and dry.  Psychiatric: Affect and judgment normal.  Vitals reviewed.   ASSESSMENT and PLAN:  Dustin Warren was seen today for annual exam.  Diagnoses and all orders for this visit:  Encounter for preventative adult health care exam with abnormal findings -     IFOBT POC (occult bld, rslt in office); Future -     CBC; Future -     Comprehensive metabolic panel; Future -     Lipid panel; Future -     TSH; Future  Family hx of colon  cancer requiring screening colonoscopy -     IFOBT POC (occult bld, rslt in office); Future  Encounter for lipid screening for cardiovascular disease -     Lipid panel; Future  Acute pain of right knee  Anemia, unspecified type    No problem-specific Assessment & Plan notes found for this encounter.     Recent Results (from the past 2160 hour(s))  Comprehensive metabolic panel     Status: Abnormal   Collection Time: 09/12/16 10:14 AM  Result Value Ref Range   Sodium 140 135 - 145 mEq/L   Potassium 4.1 3.5 - 5.1 mEq/L   Chloride 103 96 - 112 mEq/L   CO2 28 19 - 32 mEq/L   Glucose, Bld 94 70 - 99 mg/dL   BUN 9  6 - 23 mg/dL   Creatinine, Ser 1.06 0.40 - 1.50 mg/dL   Total Bilirubin 0.5 0.2 - 1.2 mg/dL   Alkaline Phosphatase 115 39 - 117 U/L   AST 80 (H) 0 - 37 U/L   ALT 186 (H) 0 - 53 U/L   Total Protein 7.2 6.0 - 8.3 g/dL   Albumin 4.7 3.5 - 5.2 g/dL   Calcium 9.5 8.4 - 10.5 mg/dL   GFR 86.51 >24.26 mL/min  Ehrlichia Antibody Panel     Status: None   Collection Time: 09/12/16 10:14 AM  Result Value Ref Range   E chaffeensis (HGE) Ab, IgG <1:64 <1:64   E chaffeensis (HGE) Ab, IgM <1:20 <1:20   Interpretaton REPORT     Comment: Antibody Not Detected   Comment REPORT     Comment: Ehrlichia chaffeenis has been identified as the causative agent of Human Monocytic Ehrlichiosis (HME). Infected individuals produce specific antibodies to E. chaffeensis that can be detected by an immuno- fluorescent antibody (IFA) test.  Single IgG IFA titers of 1:64 or greater indicate exposure to E. chaffeensis.  A four-fold rise in IgG titers between acute and convalescent samples and/or the presence of IgM antibody against E. chaffeensis suggest recent or current infection. This test was developed and its analytical performance characteristics have been determined by [employer], [place], [place]. It has not been cleared or approved by the U.S. Food and  Drug Administration. This assay has been validated pursuant to the CLIA regulations and is used for clinical purposes.   Babesia microti Antibody Panel     Status: None   Collection Time: 09/12/16 10:14 AM  Result Value Ref Range   Babesia microti IgM <1:10 Neg:<1:10   Babesia microti IgG <1:10 Neg:<1:10    Comment: This test was developed and its performance characteristics determined by [employer]. It has not been cleared or approved by the U.S. Food and Drug Administration. The FDA has determined that such clearance or approval is not necessary. This test is used for clinical purposes. It should not be regarded as investigational or research.   Lyme Ab/Western Blot Reflex     Status: None   Collection Time: 09/12/16 10:14 AM  Result Value Ref Range   B burgdorferi Ab IgG+IgM <0.90 <0.90 Index    Comment:                      Index                Interpretation                    -----                --------------                    < 0.90               NEGATIVE                    0.90-1.09            EQUIVOCAL                    > 1.09               POSITIVE   As recommended by the Food and Drug Administration (FDA), all samples with positive or equivocal results on the B. burgdorferi antibody screen will  be tested by [place] Blot/Immunoblot. Positive or equivocal screening test results should not be interpreted as truly positive until verified as such using a supplemental assay (e.g., B. burgdorferi blot).   The screening test and/or blot for B. burgdorferi antibodies may be falsely negative in early stages of Lyme disease, including the period when erythema migrans is apparent.     CBC     Status: Abnormal   Collection Time: 09/27/16 11:46 AM  Result Value Ref Range   WBC 5.8 4.0 - 10.5 K/uL   RBC 3.74 (L) 4.22 - 5.81 Mil/uL   Platelets 244.0 150.0 - 400.0 K/uL   Hemoglobin 11.4 (L) 13.0 - 17.0 g/dL   HCT 32.2 (L) 39.0 - 52.0 %   MCV 86.2 78.0 - 100.0 fl    MCHC 35.4 30.0 - 36.0 g/dL   RDW 13.0 11.5 - 15.5 %  Comprehensive metabolic panel     Status: Abnormal   Collection Time: 09/27/16 11:46 AM  Result Value Ref Range   Sodium 138 135 - 145 mEq/L   Potassium 4.2 3.5 - 5.1 mEq/L   Chloride 102 96 - 112 mEq/L   CO2 28 19 - 32 mEq/L   Glucose, Bld 95 70 - 99 mg/dL   BUN 19 6 - 23 mg/dL   Creatinine, Ser 0.92 0.40 - 1.50 mg/dL   Total Bilirubin 0.3 0.2 - 1.2 mg/dL   Alkaline Phosphatase 71 39 - 117 U/L   AST 23 0 - 37 U/L   ALT 59 (H) 0 - 53 U/L   Total Protein 6.5 6.0 - 8.3 g/dL   Albumin 4.2 3.5 - 5.2 g/dL   Calcium 9.0 8.4 - 10.5 mg/dL   GFR 101.85 >60.00 mL/min  Lipid panel     Status: Abnormal   Collection Time: 09/27/16 11:46 AM  Result Value Ref Range   Cholesterol 169 0 - 200 mg/dL    Comment: ATP III Classification       Desirable:  < 200 mg/dL               Borderline High:  200 - 239 mg/dL          High:  > = 240 mg/dL   Triglycerides 229.0 (H) 0.0 - 149.0 mg/dL    Comment: Normal:  <150 mg/dLBorderline High:  150 - 199 mg/dL   HDL 42.70 >39.00 mg/dL   VLDL 45.8 (H) 0.0 - 40.0 mg/dL   Total CHOL/HDL Ratio 4     Comment:                Men          Women1/2 Average Risk     3.4          3.3Average Risk          5.0          4.42X Average Risk          9.6          7.13X Average Risk          15.0          11.0                       NonHDL 126.09     Comment: NOTE:  Non-HDL goal should be 30 mg/dL higher than patient's LDL goal (i.e. LDL goal of < 70 mg/dL, would have non-HDL goal of < 100 mg/dL)  TSH     Status:  None   Collection Time: 09/27/16 11:46 AM  Result Value Ref Range   TSH 3.40 0.35 - 4.50 uIU/mL  LDL cholesterol, direct     Status: None   Collection Time: 09/27/16 11:46 AM  Result Value Ref Range   Direct LDL 89.0 mg/dL    Comment: Optimal:  <100 mg/dLNear or Above Optimal:  100-129 mg/dLBorderline High:  130-159 mg/dLHigh:  160-189 mg/dLVery High:  >190 mg/dL   Follow up: Return in about 3 months (around  12/28/2016), or if symptoms worsen or fail to improve, for anemia.  Wilfred Lacy, NP

## 2016-10-16 ENCOUNTER — Encounter: Payer: Self-pay | Admitting: Internal Medicine

## 2016-10-16 ENCOUNTER — Ambulatory Visit (INDEPENDENT_AMBULATORY_CARE_PROVIDER_SITE_OTHER): Payer: 59 | Admitting: Internal Medicine

## 2016-10-16 VITALS — BP 126/78 | HR 64 | Ht 69.0 in | Wt 195.0 lb

## 2016-10-16 DIAGNOSIS — D649 Anemia, unspecified: Secondary | ICD-10-CM | POA: Diagnosis not present

## 2016-10-16 DIAGNOSIS — R109 Unspecified abdominal pain: Secondary | ICD-10-CM

## 2016-10-16 MED ORDER — TRAMADOL HCL 50 MG PO TABS: 50.0000 mg | ORAL_TABLET | Freq: Three times a day (TID) | ORAL | 0 refills | Status: DC | PRN

## 2016-10-16 MED ORDER — CYCLOBENZAPRINE HCL 5 MG PO TABS: 5.0000 mg | ORAL_TABLET | Freq: Three times a day (TID) | ORAL | 1 refills | Status: DC | PRN

## 2016-10-16 NOTE — Patient Instructions (Signed)
Please take all new medication as prescribed - the pain medication, and the muscle relaxer  Please continue all other medications as before, and refills have been done if requested.  Please have the pharmacy call with any other refills you may need.  Please keep your appointments with your specialists as you may have planned  Please go to the LAB in the Basement (turn left off the elevator) for the tests to be done tomorrow  You will be contacted by phone if any changes need to be made immediately.  Otherwise, you will receive a letter about your results with an explanation, but please check with MyChart first.  Please remember to sign up for MyChart if you have not done so, as this will be important to you in the future with finding out test results, communicating by private email, and scheduling acute appointments online when needed.

## 2016-10-16 NOTE — Progress Notes (Signed)
   Subjective:    Patient ID: Dustin Warren, male    DOB: January 24, 1986, 31 y.o.   MRN: [id number]  HPI  Here to f/u, Pt c/o of 3 days onset right flank pain sharp, constant, mild to mod, worse to bend or twist at the waist, without bowel or bladder change, fever, wt loss,  worsening LE pain/numbness/weakness, gait change or falls. Denies urinary symptoms such as dysuria, frequency, urgency, hematuria or n/v, fever, chills.  Denies worsening reflux, abd pain, dysphagia, n/v, bowel change or blood.  Had recent Hgb mild low without further evaluation Past Medical History:  Diagnosis Date  . Asthma    No past surgical history on file.  reports that he has quit smoking. He has never used smokeless tobacco. He reports that he does not drink alcohol or use drugs. family history includes Arthritis in his maternal aunt, maternal grandfather, maternal uncle, and mother; Cancer in his maternal grandmother; Cancer (age of onset: 32) in his paternal grandmother; Diabetes in his mother; Heart disease in his maternal grandfather; Hyperlipidemia in his mother; Hypertension in his mother; Stroke in his paternal grandfather. No Known Allergies No current outpatient prescriptions on file prior to visit.   No current facility-administered medications on file prior to visit.    Review of Systems  Constitutional: Negative for other unusual diaphoresis or sweats HENT: Negative for ear discharge or swelling Eyes: Negative for other worsening visual disturbances Respiratory: Negative for stridor or other swelling  Gastrointestinal: Negative for worsening distension or other blood Genitourinary: Negative for retention or other urinary change Musculoskeletal: Negative for other MSK pain or swelling Skin: Negative for color change or other new lesions Neurological: Negative for worsening tremors and other numbness  Psychiatric/Behavioral: Negative for worsening agitation or other fatigue All other system neg per pt   Objective:   Physical Exam BP 126/78   Pulse 64   Ht 5\' 9"  (1.753 m)   Wt 195 lb (88.5 kg)   SpO2 99%   BMI 28.80 kg/m  \VS noted,  Constitutional: Pt appears in NAD HENT: Head: NCAT.  Right Ear: External ear normal.  Left Ear: External ear normal.  Eyes: . Pupils are equal, round, and reactive to light. Conjunctivae and EOM are normal Nose: without d/c or deformity Neck: Neck supple. Gross normal ROM Cardiovascular: Normal rate and regular rhythm.   Pulmonary/Chest: Effort normal and breath sounds without rales or wheezing.  Abd:  Soft, NT, ND, + BS, no organomegaly, with mild right flank tender without swelling or rash Neurological: Pt is alert. At baseline orientation, motor grossly intact Skin: Skin is warm. No rashes, other new lesions, no LE edema Psychiatric: Pt behavior is normal without agitation  No other exam findings     Assessment & Plan:

## 2016-10-17 ENCOUNTER — Other Ambulatory Visit (INDEPENDENT_AMBULATORY_CARE_PROVIDER_SITE_OTHER): Payer: 59

## 2016-10-17 DIAGNOSIS — R109 Unspecified abdominal pain: Secondary | ICD-10-CM

## 2016-10-17 DIAGNOSIS — D649 Anemia, unspecified: Secondary | ICD-10-CM | POA: Diagnosis not present

## 2016-10-17 LAB — CBC WITH DIFFERENTIAL/PLATELET
BASOS PCT: 0.7 % (ref 0.0–3.0)
Basophils Absolute: 0 10*3/uL (ref 0.0–0.1)
EOS ABS: 0.2 10*3/uL (ref 0.0–0.7)
Eosinophils Relative: 2.8 % (ref 0.0–5.0)
HCT: 33.1 % — ABNORMAL LOW (ref 39.0–52.0)
HEMOGLOBIN: 11.2 g/dL — AB (ref 13.0–17.0)
LYMPHS ABS: 2.4 10*3/uL (ref 0.7–4.0)
LYMPHS PCT: 45 % (ref 12.0–46.0)
MCHC: 33.9 g/dL (ref 30.0–36.0)
MCV: 84.9 fl (ref 78.0–100.0)
MONO ABS: 0.4 10*3/uL (ref 0.1–1.0)
Monocytes Relative: 7.7 % (ref 3.0–12.0)
NEUTROS PCT: 43.8 % (ref 43.0–77.0)
Neutro Abs: 2.4 10*3/uL (ref 1.4–7.7)
PLATELETS: 270 10*3/uL (ref 150.0–400.0)
RBC: 3.9 Mil/uL — ABNORMAL LOW (ref 4.22–5.81)
RDW: 12.8 % (ref 11.5–15.5)
WBC: 5.4 10*3/uL (ref 4.0–10.5)

## 2016-10-17 LAB — URINALYSIS, ROUTINE W REFLEX MICROSCOPIC
BILIRUBIN URINE: NEGATIVE
HGB URINE DIPSTICK: NEGATIVE
KETONES UR: NEGATIVE
Leukocytes, UA: NEGATIVE
Nitrite: NEGATIVE
PH: 7 (ref 5.0–8.0)
Specific Gravity, Urine: 1.01 (ref 1.000–1.030)
TOTAL PROTEIN, URINE-UPE24: NEGATIVE
URINE GLUCOSE: NEGATIVE
UROBILINOGEN UA: 0.2 (ref 0.0–1.0)

## 2016-10-17 LAB — BASIC METABOLIC PANEL
BUN: 16 mg/dL (ref 6–23)
CHLORIDE: 101 meq/L (ref 96–112)
CO2: 31 meq/L (ref 19–32)
CREATININE: 1.22 mg/dL (ref 0.40–1.50)
Calcium: 8.9 mg/dL (ref 8.4–10.5)
GFR: 73.51 mL/min (ref >60.00)
GLUCOSE: 91 mg/dL (ref 70–99)
POTASSIUM: 4.5 meq/L (ref 3.5–5.1)
Sodium: 137 mEq/L (ref 135–145)

## 2016-10-17 LAB — HEPATIC FUNCTION PANEL
ALBUMIN: 4.4 g/dL (ref 3.5–5.2)
ALT: 65 U/L — AB (ref 0–53)
AST: 33 U/L (ref 0–37)
Alkaline Phosphatase: 92 U/L (ref 39–117)
Bilirubin, Direct: 0.1 mg/dL (ref 0.0–0.3)
TOTAL PROTEIN: 7 g/dL (ref 6.0–8.3)
Total Bilirubin: 0.4 mg/dL (ref 0.2–1.2)

## 2016-10-17 LAB — IBC PANEL
IRON: 37 ug/dL — AB (ref 42–165)
SATURATION RATIOS: 7.8 % — AB (ref 20.0–50.0)
Transferrin: 337 mg/dL (ref 212.0–360.0)

## 2016-10-18 ENCOUNTER — Other Ambulatory Visit: Payer: Self-pay | Admitting: Internal Medicine

## 2016-10-18 ENCOUNTER — Encounter: Payer: Self-pay | Admitting: Internal Medicine

## 2016-10-18 ENCOUNTER — Telehealth: Payer: Self-pay

## 2016-10-18 DIAGNOSIS — D509 Iron deficiency anemia, unspecified: Secondary | ICD-10-CM

## 2016-10-18 NOTE — Telephone Encounter (Signed)
Pt informed and expressed understanding. 

## 2016-10-18 NOTE — Assessment & Plan Note (Signed)
Noted on most recent labs, mild , normocytic but no reason for anemia chronic dz; will need check iron panel, further evaluation and tx pending results

## 2016-10-18 NOTE — Telephone Encounter (Signed)
-----   Message from Biagio Borg, MD sent at 10/18/2016 12:25 PM EDT ----- Left message on MyChart, pt to cont same tx except   The test results show that your current treatment is OK, except that there is new iron deficiency anemia.  This is mild.  Please start OTC iron sulfate 325 mg (65 mg elemental) - 1 per day indefinitely for now.  We also would like to refer you to Gastroenterology as this is a very unusual finding for a young man.  You may need to have further internal evaluation to look for a source of bleeding.    Shirron to please inform pt, I will do referral

## 2016-10-18 NOTE — Assessment & Plan Note (Addendum)
Etiology unclear, I suspect most likely msk pain but cant r/o other intraabdominal issue, for labs as documented and further evaluation pending results

## 2016-11-04 ENCOUNTER — Encounter: Payer: Self-pay | Admitting: Gastroenterology

## 2016-12-27 ENCOUNTER — Encounter (INDEPENDENT_AMBULATORY_CARE_PROVIDER_SITE_OTHER): Payer: Self-pay

## 2016-12-27 ENCOUNTER — Encounter: Payer: Self-pay | Admitting: Gastroenterology

## 2016-12-27 ENCOUNTER — Other Ambulatory Visit (INDEPENDENT_AMBULATORY_CARE_PROVIDER_SITE_OTHER): Payer: 59

## 2016-12-27 ENCOUNTER — Ambulatory Visit (INDEPENDENT_AMBULATORY_CARE_PROVIDER_SITE_OTHER): Payer: 59 | Admitting: Gastroenterology

## 2016-12-27 VITALS — BP 124/66 | HR 93 | Ht 69.0 in | Wt 204.0 lb

## 2016-12-27 DIAGNOSIS — R7989 Other specified abnormal findings of blood chemistry: Secondary | ICD-10-CM

## 2016-12-27 DIAGNOSIS — R945 Abnormal results of liver function studies: Secondary | ICD-10-CM | POA: Diagnosis not present

## 2016-12-27 DIAGNOSIS — D509 Iron deficiency anemia, unspecified: Secondary | ICD-10-CM

## 2016-12-27 LAB — COMPREHENSIVE METABOLIC PANEL
ALBUMIN: 4.7 g/dL (ref 3.5–5.2)
ALK PHOS: 102 U/L (ref 39–117)
ALT: 129 U/L — AB (ref 0–53)
AST: 60 U/L — ABNORMAL HIGH (ref 0–37)
BILIRUBIN TOTAL: 0.3 mg/dL (ref 0.2–1.2)
BUN: 12 mg/dL (ref 6–23)
CALCIUM: 8.9 mg/dL (ref 8.4–10.5)
CO2: 31 mEq/L (ref 19–32)
Chloride: 100 mEq/L (ref 96–112)
Creatinine, Ser: 1.08 mg/dL (ref 0.40–1.50)
GFR: 84.51 mL/min (ref >60.00)
Glucose, Bld: 96 mg/dL (ref 70–99)
POTASSIUM: 3.7 meq/L (ref 3.5–5.1)
Sodium: 139 mEq/L (ref 135–145)
TOTAL PROTEIN: 7.5 g/dL (ref 6.0–8.3)

## 2016-12-27 LAB — CBC WITH DIFFERENTIAL/PLATELET
BASOS ABS: 0 10*3/uL (ref 0.0–0.1)
Basophils Relative: 0.5 % (ref 0.0–3.0)
EOS PCT: 2.8 % (ref 0.0–5.0)
Eosinophils Absolute: 0.2 10*3/uL (ref 0.0–0.7)
HEMATOCRIT: 44.3 % (ref 39.0–52.0)
Hemoglobin: 15 g/dL (ref 13.0–17.0)
LYMPHS ABS: 2.2 10*3/uL (ref 0.7–4.0)
LYMPHS PCT: 38.4 % (ref 12.0–46.0)
MCHC: 33.8 g/dL (ref 30.0–36.0)
MCV: 85.4 fl (ref 78.0–100.0)
MONOS PCT: 8.4 % (ref 3.0–12.0)
Monocytes Absolute: 0.5 10*3/uL (ref 0.1–1.0)
NEUTROS PCT: 49.9 % (ref 43.0–77.0)
Neutro Abs: 2.9 10*3/uL (ref 1.4–7.7)
Platelets: 274 10*3/uL (ref 150.0–400.0)
RBC: 5.18 Mil/uL (ref 4.22–5.81)
RDW: 15.2 % (ref 11.5–15.5)
WBC: 5.8 10*3/uL (ref 4.0–10.5)

## 2016-12-27 LAB — PROTIME-INR
INR: 1 ratio (ref 0.8–1.0)
PROTHROMBIN TIME: 11 s (ref 9.6–13.1)

## 2016-12-27 LAB — FERRITIN: FERRITIN: 19.4 ng/mL — AB (ref 22.0–322.0)

## 2016-12-27 LAB — IBC PANEL
IRON: 84 ug/dL (ref 42–165)
Saturation Ratios: 21.4 % (ref 20.0–50.0)
Transferrin: 280 mg/dL (ref 212.0–360.0)

## 2016-12-27 LAB — IGA: IGA: 202 mg/dL (ref 68–378)

## 2016-12-27 NOTE — Patient Instructions (Addendum)
You will get labs drawn today:  Hepatitis A (IgM and IgG), Hepatitis B surface antigen, Hepatitis B surface antibody, Hepatitis C antibody, total iron, ferritin, TIBC, ANA, AMA,  anti smooth muscle antibody, alpha 1 antitrypsin, cerulloplasm, tTG, total IgA level, CBC, CMET, INR.  You will be set up for an ultrasound of liver for elevated liver tests.  You have been scheduled for an abdominal ultrasound at [facility] (1st floor of hospital) on 01/03/17 at 9 am. Please arrive 15 minutes prior to your appointment for registration. Make certain not to have anything to eat or drink 6 hours prior to your appointment. Should you need to reschedule your appointment, please contact radiology at [phone]. This test typically takes about 30 minutes to perform.  Ranitidine 150mg  at bedtime nightly.  Normal BMI (Body Mass Index- based on height and weight) is between 19 and 25. Your BMI today is Body mass index is 30.13 kg/m. Marland Kitchen Please consider follow up  regarding your BMI with your Primary Care Provider.

## 2016-12-27 NOTE — Progress Notes (Signed)
HPI: This is a  very pleasant 31 year old man  who was referred to me by Biagio Borg, MD  to evaluate  iron deficiency anemia .    Chief complaint is iron deficiency anemia and chronically elevated liver tests  He was Benny Lennert told that he has mild iron deficiency and mild anemia. He never sees blood in his stool. He never sees bleeding at all. He really never has troubles with his bowels until he started iron recently and this has caused slight constipation for him. He has no abdominal pains. He has probably gained about 10 pounds in the past year.  He does not take NSAIDs very regularly at all  No family history of colon disease colon cancer.  Looking at his records I see that he has a chronically elevated ALT level. I don't see that he never had any other testing for elevated liver tests or ultrasound either. He has never heard that his liver tests were slightly  elevated. Liver disease does not run in his family. He has never had hepatitis or jaundice. He does not drink much alcohol.  Gets pyrosis, takes tums daily.  Getting worse in past 1-2 years.   Old Data Reviewed: Bloodwork August 2018 shows a hemoglobin of 11.2, normocytic. Iron panel shows total iron of 37, ferritin not checked, transferrin was 337, saturation ratio was 7.8.  He has chronically elevated ALT levels around 50-60. The rest of his liver tests are all normal except for 3 months ago when his AST was 80 and his ALT was 186. Never imaging of his liver.    Review of systems: Pertinent positive and negative review of systems were noted in the above HPI section. All other review negative.   Past Medical History:  Diagnosis Date  . Anemia   . Asthma     Past Surgical History:  Procedure Laterality Date  . MYRINGOTOMY      Current Outpatient Prescriptions  Medication Sig Dispense Refill  . ferrous sulfate 325 (65 FE) MG tablet Take 325 mg by mouth daily with breakfast.     No current facility-administered  medications for this visit.     Allergies as of 12/27/2016  . (No Known Allergies)    Family History  Problem Relation Age of Onset  . Arthritis Mother        OA  . Hyperlipidemia Mother   . Hypertension Mother   . Diabetes Mother   . Breast cancer Maternal Grandmother        breast  . Heart disease Maternal Grandfather   . Arthritis Maternal Grandfather        rheumatoid  . Stroke Paternal Grandfather   . Arthritis Maternal Aunt   . Arthritis Maternal Uncle   . Cancer Paternal Grandmother 13       colon, oral  . Colon cancer Neg Hx   . Stomach cancer Neg Hx     Social History   Social History  . Marital status: Married    Spouse name: N/A  . Number of children: N/A  . Years of education: N/A   Occupational History  . Not on file.   Social History Main Topics  . Smoking status: Former Research scientist (life sciences)  . Smokeless tobacco: Never Used  . Alcohol use No  . Drug use: No  . Sexual activity: Not on file   Other Topics Concern  . Not on file   Social History Narrative  . No narrative on file     Physical  Exam: BP 124/66   Pulse 93   Ht 5\' 9"  (1.753 m)   Wt 204 lb (92.5 kg)   BMI 30.13 kg/m  Constitutional: generally well-appearing Psychiatric: alert and oriented x3 Eyes: extraocular movements intact Mouth: oral pharynx moist, no lesions Neck: supple no lymphadenopathy Cardiovascular: heart regular rate and rhythm Lungs: clear to auscultation bilaterally Abdomen: soft, nontender, nondistended, no obvious ascites, no peritoneal signs, normal bowel sounds Extremities: no lower extremity edema bilaterally Skin: no lesions on visible extremities   Assessment and plan: 31 y.o. male with  mild iron deficiency anemia, chronically elevated ALT not clear if these 2 problems are related. I recommended a battery of blood tests and it liver ultrasound to workup his chronically elevated ALT. Pending this workup he will likely need upper endoscopy and also colonoscopy for  his iron deficiency anemia. I suspect if the blood testing above suggests that he has celiac sprue then we could probably forego the colonoscopy. He has been having pyrosis for 3-4 years. Tums usually works for him but he has needed increasing doses of the Tums. I recommended he try ranitidine on a nightly basis. He has no dysphagia or other alarm symptoms.    Please see the "Patient Instructions" section for addition details about the plan.   Owens Loffler, MD [facility] 12/27/2016, 3:17 PM  Cc: Biagio Borg, MD

## 2016-12-31 LAB — ALPHA-1-ANTITRYPSIN: A-1 Antitrypsin, Ser: 152 mg/dL (ref 83–199)

## 2016-12-31 LAB — HEPATITIS C ANTIBODY
Hepatitis C Ab: NONREACTIVE
SIGNAL TO CUT-OFF: 0.01 (ref <1.00)

## 2016-12-31 LAB — CERULOPLASMIN: CERULOPLASMIN: 23 mg/dL (ref 18–36)

## 2016-12-31 LAB — HEPATITIS A ANTIBODY, TOTAL: HEPATITIS A AB,TOTAL: REACTIVE — AB

## 2016-12-31 LAB — MITOCHONDRIAL ANTIBODIES: Mitochondrial M2 Ab, IgG: <=20 U

## 2016-12-31 LAB — ANA: Anti Nuclear Antibody(ANA): NEGATIVE

## 2016-12-31 LAB — TISSUE TRANSGLUTAMINASE, IGA: (tTG) Ab, IgA: 1 U/mL

## 2016-12-31 LAB — ANTI-SMOOTH MUSCLE ANTIBODY, IGG

## 2016-12-31 LAB — HEPATITIS B SURFACE ANTIGEN: Hepatitis B Surface Ag: NONREACTIVE

## 2017-01-03 ENCOUNTER — Ambulatory Visit (HOSPITAL_COMMUNITY): Payer: 59

## 2017-01-10 ENCOUNTER — Ambulatory Visit (HOSPITAL_COMMUNITY)
Admission: RE | Admit: 2017-01-10 | Discharge: 2017-01-10 | Disposition: A | Discharge status: Home / Self Care | Payer: 59 | Source: Ambulatory Visit | Attending: Gastroenterology | Admitting: Gastroenterology

## 2017-01-10 DIAGNOSIS — R7989 Other specified abnormal findings of blood chemistry: Secondary | ICD-10-CM

## 2017-01-10 DIAGNOSIS — R945 Abnormal results of liver function studies: Secondary | ICD-10-CM | POA: Diagnosis present

## 2017-01-20 ENCOUNTER — Other Ambulatory Visit: Payer: Self-pay

## 2017-01-20 ENCOUNTER — Encounter: Payer: Self-pay | Admitting: Gastroenterology

## 2017-01-20 DIAGNOSIS — R945 Abnormal results of liver function studies: Principal | ICD-10-CM

## 2017-01-20 DIAGNOSIS — R7989 Other specified abnormal findings of blood chemistry: Secondary | ICD-10-CM

## 2017-02-11 ENCOUNTER — Telehealth: Payer: Self-pay | Admitting: Gastroenterology

## 2017-02-11 NOTE — Telephone Encounter (Signed)
Left message on machine to call back  

## 2017-02-13 NOTE — Telephone Encounter (Signed)
The pt questions were answered to his satisfaction.  He will call back with any further concerns

## 2017-03-07 ENCOUNTER — Other Ambulatory Visit: Payer: Self-pay

## 2017-03-07 ENCOUNTER — Ambulatory Visit (AMBULATORY_SURGERY_CENTER): Payer: Self-pay

## 2017-03-07 VITALS — Ht 69.0 in | Wt 189.0 lb

## 2017-03-07 DIAGNOSIS — D509 Iron deficiency anemia, unspecified: Secondary | ICD-10-CM

## 2017-03-07 MED ORDER — PEG 3350-KCL-NA BICARB-NACL 420 G PO SOLR: 4000.0000 mL | Freq: Once | ORAL | 0 refills | Status: AC

## 2017-03-07 NOTE — Progress Notes (Signed)
Denies allergies to eggs or soy products. Denies complication of anesthesia or sedation. Denies use of weight loss medication. Denies use of O2.   Emmi instructions given for colonoscopy.  

## 2017-03-18 HISTORY — PX: WISDOM TOOTH EXTRACTION: SHX21

## 2017-03-24 ENCOUNTER — Encounter: Payer: Self-pay | Admitting: Gastroenterology

## 2017-03-28 ENCOUNTER — Other Ambulatory Visit: Payer: Self-pay

## 2017-03-28 ENCOUNTER — Encounter: Payer: Self-pay | Admitting: Gastroenterology

## 2017-03-28 ENCOUNTER — Ambulatory Visit (AMBULATORY_SURGERY_CENTER): Payer: 59 | Admitting: Gastroenterology

## 2017-03-28 VITALS — BP 114/74 | HR 75 | Temp 99.1°F | Resp 11 | Ht 69.0 in | Wt 204.0 lb

## 2017-03-28 DIAGNOSIS — D124 Benign neoplasm of descending colon: Secondary | ICD-10-CM | POA: Diagnosis not present

## 2017-03-28 DIAGNOSIS — K209 Esophagitis, unspecified without bleeding: Secondary | ICD-10-CM

## 2017-03-28 DIAGNOSIS — K449 Diaphragmatic hernia without obstruction or gangrene: Secondary | ICD-10-CM

## 2017-03-28 DIAGNOSIS — D123 Benign neoplasm of transverse colon: Secondary | ICD-10-CM

## 2017-03-28 DIAGNOSIS — D508 Other iron deficiency anemias: Secondary | ICD-10-CM | POA: Diagnosis not present

## 2017-03-28 DIAGNOSIS — R945 Abnormal results of liver function studies: Secondary | ICD-10-CM | POA: Diagnosis not present

## 2017-03-28 MED ORDER — SODIUM CHLORIDE 0.9 % IV SOLN: 500.0000 mL | INTRAVENOUS | Status: DC

## 2017-03-28 MED ORDER — OMEPRAZOLE 40 MG PO CPDR: DELAYED_RELEASE_CAPSULE | ORAL | 5 refills | Status: DC

## 2017-03-28 NOTE — Progress Notes (Signed)
A and O x3. Report to RN. Tolerated MAC anesthesia well.Teeth unchanged after procedure.

## 2017-03-28 NOTE — Progress Notes (Signed)
Called to room to assist during endoscopic procedure.  Patient ID and intended procedure confirmed with present staff. Received instructions for my participation in the procedure from the performing physician.  

## 2017-03-28 NOTE — Patient Instructions (Signed)
Impressions/recommendations:  Endoscopy:  Esophagitis (handout given) Hiatal hernia (handout given)  Begin omeprazole 40 mg pills, take one pill before breakfast and dinner meals. Repeat endoscopy in 2 months.  Colonoscopy:  Polyps (handout given)  YOU HAD AN ENDOSCOPIC PROCEDURE TODAY AT [place]:   Refer to the procedure report that was given to you for any specific questions about what was found during the examination.  If the procedure report does not answer your questions, please call your gastroenterologist to clarify.  If you requested that your care partner not be given the details of your procedure findings, then the procedure report has been included in a sealed envelope for you to review at your convenience later.  YOU SHOULD EXPECT: Some feelings of bloating in the abdomen. Passage of more gas than usual.  Walking can help get rid of the air that was put into your GI tract during the procedure and reduce the bloating. If you had a lower endoscopy (such as a colonoscopy or flexible sigmoidoscopy) you may notice spotting of blood in your stool or on the toilet paper. If you underwent a bowel prep for your procedure, you may not have a normal bowel movement for a few days.  Please Note:  You might notice some irritation and congestion in your nose or some drainage.  This is from the oxygen used during your procedure.  There is no need for concern and it should clear up in a day or so.  SYMPTOMS TO REPORT IMMEDIATELY:   Following lower endoscopy (colonoscopy or flexible sigmoidoscopy):  Excessive amounts of blood in the stool  Significant tenderness or worsening of abdominal pains  Swelling of the abdomen that is new, acute  Fever of 100F or higher   Following upper endoscopy (EGD)  Vomiting of blood or coffee ground material  New chest pain or pain under the shoulder blades  Painful or persistently difficult swallowing  New shortness of breath  Fever  of 100F or higher  Black, tarry-looking stools  For urgent or emergent issues, a gastroenterologist can be reached at any hour by calling [phone].   DIET:  We do recommend a small meal at first, but then you may proceed to your regular diet.  Drink plenty of fluids but you should avoid alcoholic beverages for 24 hours.  ACTIVITY:  You should plan to take it easy for the rest of today and you should NOT DRIVE or use heavy machinery until tomorrow (because of the sedation medicines used during the test).    FOLLOW UP: Our staff will call the number listed on your records the next business day following your procedure to check on you and address any questions or concerns that you may have regarding the information given to you following your procedure. If we do not reach you, we will leave a message.  However, if you are feeling well and you are not experiencing any problems, there is no need to return our call.  We will assume that you have returned to your regular daily activities without incident.  If any biopsies were taken you will be contacted by phone or by letter within the next 1-3 weeks.  Please call us at [phone] if you have not heard about the biopsies in 3 weeks.    SIGNATURES/CONFIDENTIALITY: You and/or your care partner have signed paperwork which will be entered into your electronic medical record.  These signatures attest to the fact that that the information above on your  After Visit Summary has been reviewed and is understood.  Full responsibility of the confidentiality of this discharge information lies with you and/or your care-partner.

## 2017-03-28 NOTE — Op Note (Addendum)
[place] Patient Name: Dustin Warren Procedure Date: 03/28/2017 8:58 AM MRN: [id number] Endoscopist: Milus Banister , MD Age: 32 Referring MD:  Date of Birth: 06-14-1985 Gender: Male Account #: [id number] Procedure:                Colonoscopy Indications:              Iron deficiency anemia Medicines:                Monitored Anesthesia Care Procedure:                Pre-Anesthesia Assessment:                           - Prior to the procedure, a History and Physical                            was performed, and patient medications and                            allergies were reviewed. The patient's tolerance of                            previous anesthesia was also reviewed. The risks                            and benefits of the procedure and the sedation                            options and risks were discussed with the patient.                            All questions were answered, and informed consent                            was obtained. Prior Anticoagulants: The patient has                            taken no previous anticoagulant or antiplatelet                            agents. ASA Grade Assessment: II - A patient with                            mild systemic disease. After reviewing the risks                            and benefits, the patient was deemed in                            satisfactory condition to undergo the procedure.                           After obtaining informed consent, the colonoscope  was passed under direct vision. Throughout the                            procedure, the patient's blood pressure, pulse, and                            oxygen saturations were monitored continuously. The                            Colonoscope was introduced through the anus and                            advanced to the the terminal ileum, identified by                            appendiceal orifice and ileocecal valve.  The                            colonoscopy was performed without difficulty. The                            patient tolerated the procedure well. The quality                            of the bowel preparation was excellent. The                            ileocecal valve, appendiceal orifice, TI, and                            rectum were photographed. Scope In: 9:11:09 AM Scope Out: 9:21:55 AM Scope Withdrawal Time: 0 hours 9 minutes 5 seconds  Total Procedure Duration: 0 hours 10 minutes 46 seconds  Findings:                 Two semi-pedunculated polyps were found in the                            descending colon and transverse colon. The polyps                            were 3 to 6 mm in size. These polyps were removed                            with a cold snare. Resection and retrieval were                            complete.                           Normal terminal ileum.                           The exam was otherwise without abnormality on  direct and retroflexion views. Complications:            No immediate complications. Estimated blood loss:                            None. Estimated Blood Loss:     Estimated blood loss: none. Impression:               - Two 3 to 6 mm polyps in the descending colon and                            in the transverse colon, removed with a cold snare.                            Resected and retrieved.                           - The examination was otherwise normal on direct                            and retroflexion views (including normal terminal                            ileum). Recommendation:           - Patient has a contact number available for                            emergencies. The signs and symptoms of potential                            delayed complications were discussed with the                            patient. Return to normal activities tomorrow.                            Written  discharge instructions were provided to the                            patient.                           - Resume previous diet.                           - Continue present medications.                           - EGD now to continue workup for IDA.                           You will receive a letter within 2-3 weeks with the                            pathology results and my final recommendations.  If the polyp(s) is proven to be 'pre-cancerous' on                            pathology, you will need repeat colonoscopy in 5                            years. Milus Banister, MD 03/28/2017 9:25:20 AM This report has been signed electronically.

## 2017-03-28 NOTE — Op Note (Signed)
[place] Patient Name: Dustin Warren Procedure Date: 03/28/2017 8:57 AM MRN: [id number] Endoscopist: Milus Banister , MD Age: 32 Referring MD:  Date of Birth: 1985/03/25 Gender: Male Account #: [id number] Procedure:                Upper GI endoscopy Indications:              Iron deficiency anemia, Heartburn Medicines:                Monitored Anesthesia Care Procedure:                Pre-Anesthesia Assessment:                           - Prior to the procedure, a History and Physical                            was performed, and patient medications and                            allergies were reviewed. The patient's tolerance of                            previous anesthesia was also reviewed. The risks                            and benefits of the procedure and the sedation                            options and risks were discussed with the patient.                            All questions were answered, and informed consent                            was obtained. Prior Anticoagulants: The patient has                            taken no previous anticoagulant or antiplatelet                            agents. ASA Grade Assessment: II - A patient with                            mild systemic disease. After reviewing the risks                            and benefits, the patient was deemed in                            satisfactory condition to undergo the procedure.                           After obtaining informed consent, the endoscope was  passed under direct vision. Throughout the                            procedure, the patient's blood pressure, pulse, and                            oxygen saturations were monitored continuously. The                            Endoscope was introduced through the mouth, and                            advanced to the second part of duodenum. The upper                            GI endoscopy was  accomplished without difficulty.                            The patient tolerated the procedure well. Scope In: Scope Out: Findings:                 LA Grade D (one or more mucosal breaks involving at                            least 75% of esophageal circumference) esophagitis                            with bleeding was found in the lower third of the                            esophagus. Biopsies were taken with a cold forceps                            for histology.                           A small hiatal hernia was present, no sign of                            Cameron's type erosions.                           The exam was otherwise without abnormality. Complications:            No immediate complications. Estimated blood loss:                            None. Estimated Blood Loss:     Estimated blood loss: none. Impression:               - Severe, ulcerative, friable acid related                            esophagitis, biopsied to exclude neoplasm. This is  almost certainly the cause of your iron deficiency                            anemia.                           - Small hiatal hernia without Cameron's erosions.                           - The examination was otherwise normal. Recommendation:           - Patient has a contact number available for                            emergencies. The signs and symptoms of potential                            delayed complications were discussed with the                            patient. Return to normal activities tomorrow.                            Written discharge instructions were provided to the                            patient.                           - Resume previous diet.                           - New prescription for omeprazole 40mg  pills, take                            one pill before BF and dinner meals daily, disp 60                            with 5 refills.                            - Await pathology results.                           - Repeat upper endoscopy in 2 months to check                            healing, check for underlying Barrett's change. Milus Banister, MD 03/28/2017 9:35:32 AM This report has been signed electronically.

## 2017-03-31 ENCOUNTER — Telehealth: Payer: Self-pay | Admitting: *Deleted

## 2017-03-31 NOTE — Telephone Encounter (Signed)
  Follow up Call-  Call back number 03/28/2017  Post procedure Call Back phone  # [phone]  Permission to leave phone message Yes  Some recent data might be hidden     Patient questions:  Do you have a fever, pain , or abdominal swelling? No. Pain Score  0 *  Have you tolerated food without any problems? Yes.    Have you been able to return to your normal activities? Yes.    Do you have any questions about your discharge instructions: Diet   No. Medications  No. Follow up visit  No.  Do you have questions or concerns about your Care? No.  Actions: * If pain score is 4 or above: No action needed, pain <4.

## 2017-04-07 ENCOUNTER — Encounter: Payer: Self-pay | Admitting: Gastroenterology

## 2017-05-16 ENCOUNTER — Ambulatory Visit (AMBULATORY_SURGERY_CENTER): Payer: Self-pay | Admitting: *Deleted

## 2017-05-16 ENCOUNTER — Other Ambulatory Visit: Payer: Self-pay

## 2017-05-16 VITALS — Ht 68.25 in | Wt 192.0 lb

## 2017-05-16 DIAGNOSIS — K209 Esophagitis, unspecified without bleeding: Secondary | ICD-10-CM

## 2017-05-16 NOTE — Progress Notes (Signed)
No egg or soy allergy known to patient  No issues with past sedation with any surgeries  or procedures, no intubation problems  No diet pills per patient No home 02 use per patient  No blood thinners per patient  Pt denies issues with constipation  No A fib or A flutter  EMMI video sent to pt's e mail  

## 2017-05-22 ENCOUNTER — Encounter: Payer: Self-pay | Admitting: Gastroenterology

## 2017-05-30 ENCOUNTER — Ambulatory Visit (AMBULATORY_SURGERY_CENTER): Payer: 59 | Admitting: Gastroenterology

## 2017-05-30 ENCOUNTER — Encounter: Payer: Self-pay | Admitting: Gastroenterology

## 2017-05-30 ENCOUNTER — Other Ambulatory Visit: Payer: Self-pay

## 2017-05-30 VITALS — BP 123/73 | HR 57 | Temp 98.0°F | Resp 9 | Ht 68.25 in | Wt 192.0 lb

## 2017-05-30 DIAGNOSIS — K222 Esophageal obstruction: Secondary | ICD-10-CM | POA: Diagnosis not present

## 2017-05-30 DIAGNOSIS — K209 Esophagitis, unspecified without bleeding: Secondary | ICD-10-CM

## 2017-05-30 MED ORDER — SODIUM CHLORIDE 0.9 % IV SOLN: 500.0000 mL | Freq: Once | INTRAVENOUS | Status: DC

## 2017-05-30 NOTE — Patient Instructions (Signed)
Impression/recommendations:  Hiatal Hernia (handout given)  Ok to decrease your omeprazole to once daily dosing (prior to breakfast meal), indefinitely.  YOU HAD AN ENDOSCOPIC PROCEDURE TODAY AT [facility]:   Refer to the procedure report that was given to you for any specific questions about what was found during the examination.  If the procedure report does not answer your questions, please call your gastroenterologist to clarify.  If you requested that your care partner not be given the details of your procedure findings, then the procedure report has been included in a sealed envelope for you to review at your convenience later.  YOU SHOULD EXPECT: Some feelings of bloating in the abdomen. Passage of more gas than usual.  Walking can help get rid of the air that was put into your GI tract during the procedure and reduce the bloating. If you had a lower endoscopy (such as a colonoscopy or flexible sigmoidoscopy) you may notice spotting of blood in your stool or on the toilet paper. If you underwent a bowel prep for your procedure, you may not have a normal bowel movement for a few days.  Please Note:  You might notice some irritation and congestion in your nose or some drainage.  This is from the oxygen used during your procedure.  There is no need for concern and it should clear up in a day or so.  SYMPTOMS TO REPORT IMMEDIATELY:   Following upper endoscopy (EGD)  Vomiting of blood or coffee ground material  New chest pain or pain under the shoulder blades  Painful or persistently difficult swallowing  New shortness of breath  Fever of 100F or higher  Black, tarry-looking stools  For urgent or emergent issues, a gastroenterologist can be reached at any hour by calling [phone].   DIET:  We do recommend a small meal at first, but then you may proceed to your regular diet.  Drink plenty of fluids but you should avoid alcoholic beverages for 24 hours.  ACTIVITY:   You should plan to take it easy for the rest of today and you should NOT DRIVE or use heavy machinery until tomorrow (because of the sedation medicines used during the test).    FOLLOW UP: Our staff will call the number listed on your records the next business day following your procedure to check on you and address any questions or concerns that you may have regarding the information given to you following your procedure. If we do not reach you, we will leave a message.  However, if you are feeling well and you are not experiencing any problems, there is no need to return our call.  We will assume that you have returned to your regular daily activities without incident.  If any biopsies were taken you will be contacted by phone or by letter within the next 1-3 weeks.  Please call us at [phone] if you have not heard about the biopsies in 3 weeks.    SIGNATURES/CONFIDENTIALITY: You and/or your care partner have signed paperwork which will be entered into your electronic medical record.  These signatures attest to the fact that that the information above on your After Visit Summary has been reviewed and is understood.  Full responsibility of the confidentiality of this discharge information lies with you and/or your care-partner.

## 2017-05-30 NOTE — Progress Notes (Signed)
A/ox3 pleased with MAC, report to [facility]

## 2017-05-30 NOTE — Op Note (Signed)
[facility] Patient Name: Dustin Warren Procedure Date: 05/30/2017 8:16 AM MRN: [id number] Endoscopist: Milus Banister , MD Age: 32 Referring MD:  Date of Birth: 08-04-1985 Gender: Male Account #: [id number] Procedure:                Upper GI endoscopy Indications:              Heartburn, severe ulcerative esophagitis 03/2017,                            has been on PPI twice daily since then, pyrosis gone Medicines:                Monitored Anesthesia Care Procedure:                Pre-Anesthesia Assessment:                           - Prior to the procedure, a History and Physical                            was performed, and patient medications and                            allergies were reviewed. The patient's tolerance of                            previous anesthesia was also reviewed. The risks                            and benefits of the procedure and the sedation                            options and risks were discussed with the patient.                            All questions were answered, and informed consent                            was obtained. Prior Anticoagulants: The patient has                            taken no previous anticoagulant or antiplatelet                            agents. ASA Grade Assessment: II - A patient with                            mild systemic disease. After reviewing the risks                            and benefits, the patient was deemed in                            satisfactory condition to undergo the procedure.  After obtaining informed consent, the endoscope was                            passed under direct vision. Throughout the                            procedure, the patient's blood pressure, pulse, and                            oxygen saturations were monitored continuously. The                            Model GIF-HQ190 [id number]) scope was introduced   through the mouth, and advanced to the second part                            of duodenum. The upper GI endoscopy was                            accomplished without difficulty. The patient                            tolerated the procedure well. Scope In: Scope Out: Findings:                 There was a very mild, focal peptic appearing                            stricture at the GE junction above a small hiatal                            hernia.                           The exam was otherwise without abnormality. Complications:            No immediate complications. Estimated blood loss:                            None. Estimated Blood Loss:     Estimated blood loss: none. Impression:               - Focal, very mild peptic stricture above a small                            hiatal hernia.                           - The previously noted severe ulcerative                            esophagitis has completely healed. Recommendation:           - Patient has a contact number available for                            emergencies. The signs and symptoms of potential  delayed complications were discussed with the                            patient. Return to normal activities tomorrow.                            Written discharge instructions were provided to the                            patient.                           - Resume previous diet.                           - OK to decrease your omeprazole to once daily                            dosing (prior to BF meal), indefinitely.                           - Call if you have increasing problems with                            swallowing. Milus Banister, MD 05/30/2017 8:28:50 AM This report has been signed electronically.

## 2017-06-02 ENCOUNTER — Telehealth: Payer: Self-pay | Admitting: *Deleted

## 2017-06-02 ENCOUNTER — Telehealth: Payer: Self-pay

## 2017-06-02 NOTE — Telephone Encounter (Signed)
Duplicate

## 2017-06-02 NOTE — Telephone Encounter (Signed)
  Follow up Call-  Call back number 05/30/2017 03/28/2017  Post procedure Call Back phone  # [phone]  Permission to leave phone message Yes Yes  Some recent data might be hidden     Patient questions:  Do you have a fever, pain , or abdominal swelling? No. Pain Score  0 *  Have you tolerated food without any problems? Yes.    Have you been able to return to your normal activities? Yes.    Do you have any questions about your discharge instructions: Diet   No. Medications  No. Follow up visit  No.  Do you have questions or concerns about your Care? No.  Actions: * If pain score is 4 or above: No action needed, pain <4.

## 2017-06-04 ENCOUNTER — Encounter: Payer: 59 | Admitting: Internal Medicine

## 2017-06-09 ENCOUNTER — Ambulatory Visit (INDEPENDENT_AMBULATORY_CARE_PROVIDER_SITE_OTHER): Payer: Self-pay | Admitting: Family Medicine

## 2017-06-09 ENCOUNTER — Encounter: Payer: Self-pay | Admitting: Family Medicine

## 2017-06-09 ENCOUNTER — Other Ambulatory Visit: Payer: Self-pay

## 2017-06-09 VITALS — BP 102/60 | HR 77 | Temp 98.5°F | Resp 18 | Ht 68.0 in | Wt 185.6 lb

## 2017-06-09 DIAGNOSIS — Z024 Encounter for examination for driving license: Secondary | ICD-10-CM

## 2017-06-09 NOTE — Patient Instructions (Addendum)
  2 year card for [employer]. Thanks for coming in today.    IF you received an x-ray today, you will receive an invoice from [facility]. Please contact [facility] at [phone] with questions or concerns regarding your invoice.   IF you received labwork today, you will receive an invoice from [facility]. Please contact [facility] at [phone] with questions or concerns regarding your invoice.   Our billing staff will not be able to assist you with questions regarding bills from these companies.  You will be contacted with the lab results as soon as they are available. The fastest way to get your results is to activate your My Chart account. Instructions are located on the last page of this paperwork. If you have not heard from [place] regarding the results in 2 weeks, please contact this office.

## 2017-06-09 NOTE — Progress Notes (Signed)
Subjective:  By signing my name below, I, Moises Blood, attest that this documentation has been prepared under the direction and in the presence of Merri Ray, MD. Electronically Signed: Moises Blood, [place]. 06/09/2017 , 5:10 PM .  Patient was seen in Room 11 .   Patient ID: Dustin Warren, male    DOB: [birth year], 32 y.o.   MRN: [id number] Chief Complaint  Patient presents with  . DOT   HPI Dustin Warren is a 32 y.o. male Here for DOT physical. He has a history of esophagitis, treated by Dr. Ardis Hughs. He was taking PPI twice daily, but decreased to once per day based on notes 10 days ago after endoscopy. He had elevated liver tests and anemia in the past. He did have a normal ultrasound of his liver previously. He is followed by his GI. He had a normal hemoglobin of 15.0 in Oct 2018. He is a smoker.   He's been trying to take iron supplement regularly but had normal hemoglobin in Oct. He denies lightheadedness or dizziness. He had 4 wisdom teeth extraction in January. He denies being informed having COPD, emphysema or heart issues. He mentions asthma as a child but no further or recent issues. He wears glasses all the time including to drive; denies glaucoma, or other chronic eye problems. He denies glare problems at night. He denies weakness in his extremities. He denies snoring or day time somnolence. His mother has history of OSA.   This is his first DOT physical. 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   Patient Active Problem List   Diagnosis Date Noted  . Right flank pain 10/16/2016  . Anemia 09/27/2016  . Acute pain of right knee 09/27/2016  . Erythema migrans (Lyme disease) 09/12/2016  . Routine general medical examination at a health care facility 05/12/2014   Past Medical History:  Diagnosis Date  . Allergy   . Anemia   . Asthma    as a child  . GERD (gastroesophageal reflux disease)   . Neuromuscular disorder ([place])    muscle pain after  tick bite    Past Surgical History:  Procedure Laterality Date  . COLONOSCOPY    . MYRINGOTOMY    . POLYPECTOMY    . TYMPANOSTOMY TUBE PLACEMENT    . UPPER GASTROINTESTINAL ENDOSCOPY     No Known Allergies Prior to Admission medications   Medication Sig Start Date End Date Taking? Authorizing Provider  ferrous sulfate 325 (65 FE) MG tablet Take 325 mg by mouth daily with breakfast.    [provider]  omeprazole (PRILOSEC) 40 MG capsule Take one pill before breakfast and dinner meals daily 03/28/17   Milus Banister, MD  ranitidine (ZANTAC) 150 MG capsule Take 150 mg by mouth every evening.    [provider]   Social History   Socioeconomic History  . Marital status: Married    Spouse name: Not on file  . Number of children: Not on file  . Years of education: Not on file  . Highest education level: Not on file  Occupational History  . Not on file  Social Needs  . Financial resource strain: Not on file  . Food insecurity:    Worry: Not on file    Inability: Not on file  . Transportation needs:    Medical: Not on file    Non-medical: Not on file  Tobacco Use  . Smoking status: Current Every Day Smoker  Types: Cigarettes  . Smokeless tobacco: Never Used  . Tobacco comment: 1-2 cigarettes per day  Substance and Sexual Activity  . Alcohol use: Yes    Alcohol/week: 0.0 oz    Comment: Occasional  . Drug use: No  . Sexual activity: Not on file  Lifestyle  . Physical activity:    Days per week: Not on file    Minutes per session: Not on file  . Stress: Not on file  Relationships  . Social connections:    Talks on phone: Not on file    Gets together: Not on file    Attends religious service: Not on file    Active member of club or organization: Not on file    Attends meetings of clubs or organizations: Not on file    Relationship status: Not on file  . Intimate partner violence:    Fear of current or ex partner: Not on file    Emotionally  abused: Not on file    Physically abused: Not on file    Forced sexual activity: Not on file  Other Topics Concern  . Not on file  Social History Narrative  . Not on file   Review of Systems  Constitutional: Negative for fatigue and unexpected weight change.  Eyes: Negative for visual disturbance.  Respiratory: Negative for cough, chest tightness and shortness of breath.   Cardiovascular: Negative for chest pain, palpitations and leg swelling.  Gastrointestinal: Negative for abdominal pain and blood in stool.  Neurological: Negative for dizziness, light-headedness and headaches.       Objective:   Physical Exam  Constitutional: He is oriented to person, place, and time. He appears well-developed and well-nourished.  HENT:  Head: Normocephalic and atraumatic.  Right Ear: External ear normal.  Left Ear: External ear normal.  Mouth/Throat: Oropharynx is clear and moist.  Eyes: Pupils are equal, round, and reactive to light. Conjunctivae and EOM are normal.  Neck: Normal range of motion. Neck supple. No thyromegaly present.  Cardiovascular: Normal rate, regular rhythm, normal heart sounds and intact distal pulses.  Pulmonary/Chest: Effort normal and breath sounds normal. No respiratory distress. He has no wheezes.  Abdominal: Soft. He exhibits no distension. There is no tenderness. Hernia confirmed negative in the right inguinal area and confirmed negative in the left inguinal area.  Musculoskeletal: Normal range of motion. He exhibits no edema or tenderness.  Lymphadenopathy:    He has no cervical adenopathy.  Neurological: He is alert and oriented to person, place, and time. He has normal reflexes.  Skin: Skin is warm and dry.  Psychiatric: He has a normal mood and affect. His behavior is normal.  Vitals reviewed.    Vitals:   06/09/17 1658  BP: 102/60  Pulse: 77  Resp: 18  Temp: 98.5 F (36.9 C)  TempSrc: Oral  SpO2: 98%  Weight: 185 lb 9.6 oz (84.2 kg)  Height: 5'  8" (1.727 m)     Visual Acuity Screening   Right eye Left eye Both eyes  Without correction:     With correction: 20/13 20/13 20/13-1  Comments: Passed color test.  Passed horizontal field test with 85 degrees.   Hearing Screening Comments: Passed whisper test.      Assessment & Plan:   Dustin Warren is a 32 y.o. 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 2 year card, no apparent restrictions.   No orders of the defined types were placed in this encounter.  Patient Instructions  2 year card for DOT. Thanks for coming in today.    IF you received an x-ray today, you will receive an invoice from [facility]. Please contact [facility] at [phone] with questions or concerns regarding your invoice.   IF you received labwork today, you will receive an invoice from [place]. Please contact [facility] at [phone] with questions or concerns regarding your invoice.   Our billing staff will not be able to assist you with questions regarding bills from these companies.  You will be contacted with the lab results as soon as they are available. The fastest way to get your results is to activate your My Chart account. Instructions are located on the last page of this paperwork. If you have not heard from [place] regarding the results in 2 weeks, please contact this office.      I personally performed the services described in this documentation, which was scribed in my presence. The recorded information has been reviewed and considered for accuracy and completeness, addended by me as needed, and agree with information above.  Signed,   Merri Ray, MD [facility].  06/11/17 1:34 PM

## 2017-11-11 ENCOUNTER — Telehealth: Payer: Self-pay | Admitting: Internal Medicine

## 2017-11-11 NOTE — Telephone Encounter (Unsigned)
Copied from [id number]. Topic: Quick Communication - See Telephone Encounter >> Nov 11, 2017  3:56 PM Neva Seat wrote: Pt needing a list of current medications signed by Dr. Sharlet Salina.  Pt's wife will be in area next Fri. Sept. 6th to pick it up.

## 2017-11-11 NOTE — Telephone Encounter (Signed)
Left message for patient to call back to schedule appointment.

## 2017-11-11 NOTE — Telephone Encounter (Signed)
Can you make patient an appointment if they want a current record of medications. Thank you

## 2017-11-11 NOTE — Telephone Encounter (Signed)
I have not seen since 2017 and therefore may not have current list of medications.

## 2017-11-20 ENCOUNTER — Other Ambulatory Visit (INDEPENDENT_AMBULATORY_CARE_PROVIDER_SITE_OTHER): Payer: Managed Care, Other (non HMO)

## 2017-11-20 ENCOUNTER — Ambulatory Visit (INDEPENDENT_AMBULATORY_CARE_PROVIDER_SITE_OTHER): Payer: Managed Care, Other (non HMO) | Admitting: Internal Medicine

## 2017-11-20 ENCOUNTER — Encounter: Payer: Self-pay | Admitting: Internal Medicine

## 2017-11-20 ENCOUNTER — Telehealth: Payer: Self-pay | Admitting: Internal Medicine

## 2017-11-20 VITALS — BP 100/70 | HR 76 | Temp 98.1°F | Ht 68.0 in | Wt 194.0 lb

## 2017-11-20 DIAGNOSIS — Z23 Encounter for immunization: Secondary | ICD-10-CM

## 2017-11-20 DIAGNOSIS — R7401 Elevation of levels of liver transaminase levels: Secondary | ICD-10-CM | POA: Insufficient documentation

## 2017-11-20 DIAGNOSIS — R740 Nonspecific elevation of levels of transaminase and lactic acid dehydrogenase [LDH]: Secondary | ICD-10-CM

## 2017-11-20 DIAGNOSIS — D649 Anemia, unspecified: Secondary | ICD-10-CM

## 2017-11-20 DIAGNOSIS — K209 Esophagitis, unspecified without bleeding: Secondary | ICD-10-CM

## 2017-11-20 DIAGNOSIS — Z0000 Encounter for general adult medical examination without abnormal findings: Secondary | ICD-10-CM | POA: Diagnosis not present

## 2017-11-20 LAB — CBC
HCT: 43.8 % (ref 39.0–52.0)
HEMOGLOBIN: 15.5 g/dL (ref 13.0–17.0)
MCHC: 35.4 g/dL (ref 30.0–36.0)
MCV: 83.4 fl (ref 78.0–100.0)
PLATELETS: 251 10*3/uL (ref 150.0–400.0)
RBC: 5.25 Mil/uL (ref 4.22–5.81)
RDW: 13 % (ref 11.5–15.5)
WBC: 6 10*3/uL (ref 4.0–10.5)

## 2017-11-20 LAB — COMPREHENSIVE METABOLIC PANEL
ALT: 70 U/L — ABNORMAL HIGH (ref 0–53)
AST: 30 U/L (ref 0–37)
Albumin: 4.8 g/dL (ref 3.5–5.2)
Alkaline Phosphatase: 99 U/L (ref 39–117)
BUN: 13 mg/dL (ref 6–23)
CALCIUM: 9.5 mg/dL (ref 8.4–10.5)
CHLORIDE: 100 meq/L (ref 96–112)
CO2: 33 mEq/L — ABNORMAL HIGH (ref 19–32)
Creatinine, Ser: 1.12 mg/dL (ref 0.40–1.50)
GFR: 80.57 mL/min (ref >60.00)
Glucose, Bld: 87 mg/dL (ref 70–99)
Potassium: 4.1 mEq/L (ref 3.5–5.1)
Sodium: 138 mEq/L (ref 135–145)
Total Bilirubin: 0.6 mg/dL (ref 0.2–1.2)
Total Protein: 7.7 g/dL (ref 6.0–8.3)

## 2017-11-20 LAB — LIPID PANEL
CHOL/HDL RATIO: 4
Cholesterol: 168 mg/dL (ref 0–200)
HDL: 42.4 mg/dL (ref >39.00)
NONHDL: 125.65
TRIGLYCERIDES: 205 mg/dL — AB (ref 0.0–149.0)
VLDL: 41 mg/dL — ABNORMAL HIGH (ref 0.0–40.0)

## 2017-11-20 LAB — LDL CHOLESTEROL, DIRECT: Direct LDL: 94 mg/dL

## 2017-11-20 MED ORDER — OMEPRAZOLE 40 MG PO CPDR: 40.0000 mg | DELAYED_RELEASE_CAPSULE | Freq: Every day | ORAL | 3 refills | Status: DC

## 2017-11-20 NOTE — Progress Notes (Signed)
   Subjective:    Patient ID: Dustin Warren, male    DOB: 15-Jan-1986, 32 y.o.   MRN: [id number]  HPI The patient is a 32 YO man coming in for physical.   PMH, [facility], social history reviewed and updated.   Review of Systems  Constitutional: Negative.   HENT: Negative.   Eyes: Negative.   Respiratory: Negative for cough, chest tightness and shortness of breath.   Cardiovascular: Negative for chest pain, palpitations and leg swelling.  Gastrointestinal: Negative for abdominal distention, abdominal pain, constipation, diarrhea, nausea and vomiting.  Musculoskeletal: Negative.   Skin: Negative.   Neurological: Negative.   Psychiatric/Behavioral: Negative.       Objective:   Physical Exam  Constitutional: He is oriented to person, place, and time. He appears well-developed and well-nourished.  HENT:  Head: Normocephalic and atraumatic.  Eyes: EOM are normal.  Neck: Normal range of motion.  Cardiovascular: Normal rate and regular rhythm.  Pulmonary/Chest: Effort normal and breath sounds normal. No respiratory distress. He has no wheezes. He has no rales.  Abdominal: Soft. Bowel sounds are normal. He exhibits no distension. There is no tenderness. There is no rebound.  Musculoskeletal: He exhibits no edema.  Neurological: He is alert and oriented to person, place, and time. Coordination normal.  Skin: Skin is warm and dry.  Psychiatric: He has a normal mood and affect.   Vitals:   11/20/17 1604  BP: 100/70  Pulse: 76  Temp: 98.1 F (36.7 C)  TempSrc: Oral  SpO2: 99%  Weight: 194 lb (88 kg)  Height: 5\' 8"  (1.727 m)      Assessment & Plan:  Flu shot given at visit

## 2017-11-20 NOTE — Assessment & Plan Note (Signed)
Checking CBC, ulceration in esophagus healed on last EGD and taking omeprazole daily.

## 2017-11-20 NOTE — Assessment & Plan Note (Signed)
LAst year was elevated above prior levels. Had blood testing with GI without etiology and [place] without fatty liver. Needs LFTs today and referral back to GI for possible biopsy if levels are still above his normal range.

## 2017-11-20 NOTE — Assessment & Plan Note (Signed)
Checking hep b surface antibody. Colonoscopy due in 5 years. Flu shot given today. Tetanus up to date. Declines need for HIV screening. Counseled about sun safety, has stopped smoking. Given screening recommendations.

## 2017-11-20 NOTE — Patient Instructions (Addendum)
Health Maintenance, Male A healthy lifestyle and preventive care is important for your health and wellness. Ask your health care provider about what schedule of regular examinations is right for you. What should I know about weight and diet? Eat a Healthy Diet  Eat plenty of vegetables, fruits, whole grains, low-fat dairy products, and lean protein.  Do not eat a lot of foods high in solid fats, added sugars, or salt.  Maintain a Healthy Weight Regular exercise can help you achieve or maintain a healthy weight. You should:  Do at least 150 minutes of exercise each week. The exercise should increase your heart rate and make you sweat (moderate-intensity exercise).  Do strength-training exercises at least twice a week.  Watch Your Levels of Cholesterol and Blood Lipids  Have your blood tested for lipids and cholesterol every 5 years starting at 32 years of age. If you are at high risk for heart disease, you should start having your blood tested when you are 32 years old. You may need to have your cholesterol levels checked more often if: ? Your lipid or cholesterol levels are high. ? You are older than 32 years of age. ? You are at high risk for heart disease.  What should I know about cancer screening? Many types of cancers can be detected early and may often be prevented. Lung Cancer  You should be screened every year for lung cancer if: ? You are a current smoker who has smoked for at least 30 years. ? You are a former smoker who has quit within the past 15 years.  Talk to your health care provider about your screening options, when you should start screening, and how often you should be screened.  Colorectal Cancer  Routine colorectal cancer screening usually begins at 32 years of age and should be repeated every 5-10 years until you are 32 years old. You may need to be screened more often if early forms of precancerous polyps or small growths are found. Your health care provider  may recommend screening at an earlier age if you have risk factors for colon cancer.  Your health care provider may recommend using home test kits to check for hidden blood in the stool.  A small camera at the end of a tube can be used to examine your colon (sigmoidoscopy or colonoscopy). This checks for the earliest forms of colorectal cancer.  Prostate and Testicular Cancer  Depending on your age and overall health, your health care provider may do certain tests to screen for prostate and testicular cancer.  Talk to your health care provider about any symptoms or concerns you have about testicular or prostate cancer.  Skin Cancer  Check your skin from head to toe regularly.  Tell your health care provider about any new moles or changes in moles, especially if: ? There is a change in a mole's size, shape, or color. ? You have a mole that is larger than a pencil eraser.  Always use sunscreen. Apply sunscreen liberally and repeat throughout the day.  Protect yourself by wearing long sleeves, pants, a wide-brimmed hat, and sunglasses when outside.  What should I know about heart disease, diabetes, and high blood pressure?  If you are 18-39 years of age, have your blood pressure checked every 3-5 years. If you are 40 years of age or older, have your blood pressure checked every year. You should have your blood pressure measured twice-once when you are at a hospital or clinic, and once when   you are not at a hospital or clinic. Record the average of the two measurements. To check your blood pressure when you are not at a hospital or clinic, you can use: ? An automated blood pressure machine at a pharmacy. ? A home blood pressure monitor.  Talk to your health care provider about your target blood pressure.  If you are between 46-59 years old, ask your health care provider if you should take aspirin to prevent heart disease.  Have regular diabetes screenings by checking your fasting blood  sugar level. ? If you are at a normal weight and have a low risk for diabetes, have this test once every three years after the age of 52. ? If you are overweight and have a high risk for diabetes, consider being tested at a younger age or more often.  A one-time screening for abdominal aortic aneurysm (AAA) by ultrasound is recommended for men aged 33-75 years who are current or former smokers. What should I know about preventing infection? Hepatitis B If you have a higher risk for hepatitis B, you should be screened for this virus. Talk with your health care provider to find out if you are at risk for hepatitis B infection. Hepatitis C Blood testing is recommended for:  Everyone born from [birth year] through 1965.  Anyone with known risk factors for hepatitis C.  Sexually Transmitted Diseases (STDs)  You should be screened each year for STDs including gonorrhea and chlamydia if: ? You are sexually active and are younger than 32 years of age. ? You are older than 32 years of age and your health care provider tells you that you are at risk for this type of infection. ? Your sexual activity has changed since you were last screened and you are at an increased risk for chlamydia or gonorrhea. Ask your health care provider if you are at risk.  Talk with your health care provider about whether you are at high risk of being infected with HIV. Your health care provider may recommend a prescription medicine to help prevent HIV infection.  What else can I do?  Schedule regular health, dental, and eye exams.  Stay current with your vaccines (immunizations).  Do not use any tobacco products, such as cigarettes, chewing tobacco, and e-cigarettes. If you need help quitting, ask your health care provider.  Limit alcohol intake to no more than 2 drinks per day. One drink equals 12 ounces of beer, 5 ounces of wine, or 1 ounces of hard liquor.  Do not use street drugs.  Do not share needles.  Ask your  health care provider for help if you need support or information about quitting drugs.  Tell your health care provider if you often feel depressed.  Tell your health care provider if you have ever been abused or do not feel safe at home. This information is not intended to replace advice given to you by your health care provider. Make sure you discuss any questions you have with your health care provider. Document Released: 08/31/2007 Document Revised: 11/01/2015 Document Reviewed: 12/06/2014 Elsevier Interactive Patient Education  2018 [place].   High-Fiber Diet Fiber, also called dietary fiber, is a type of carbohydrate found in fruits, vegetables, whole grains, and beans. A high-fiber diet can have many health benefits. Your health care provider may recommend a high-fiber diet to help:  Prevent constipation. Fiber can make your bowel movements more regular.  Lower your cholesterol.  Relieve hemorrhoids, uncomplicated diverticulosis, or irritable bowel syndrome.  Prevent overeating as part of a weight-loss plan.  Prevent heart disease, type 2 diabetes, and certain cancers.  What is my plan? The recommended daily intake of fiber includes:  38 grams for men under age 71.  59 grams for men over age 28.  14 grams for women under age 73.  57 grams for women over age 58.  You can get the recommended daily intake of dietary fiber by eating a variety of fruits, vegetables, grains, and beans. Your health care provider may also recommend a fiber supplement if it is not possible to get enough fiber through your diet. What do I need to know about a high-fiber diet?  Fiber supplements have not been widely studied for their effectiveness, so it is better to get fiber through food sources.  Always check the fiber content on thenutrition facts label of any prepackaged food. Look for foods that contain at least 5 grams of fiber per serving.  Ask your dietitian if you have questions  about specific foods that are related to your condition, especially if those foods are not listed in the following section.  Increase your daily fiber consumption gradually. Increasing your intake of dietary fiber too quickly may cause bloating, cramping, or gas.  Drink plenty of water. Water helps you to digest fiber. What foods can I eat? Grains Whole-grain breads. Multigrain cereal. Oats and oatmeal. Brown rice. Barley. Bulgur wheat. [place]. Bran muffins. Popcorn. Rye wafer crackers. Vegetables Sweet potatoes. Spinach. Kale. Artichokes. Cabbage. Broccoli. Green peas. Carrots. Squash. Fruits Berries. Pears. Apples. Oranges. Avocados. Prunes and raisins. Dried figs. Meats and Other Protein Sources [place], kidney, pinto, and soy beans. Split peas. Lentils. Nuts and seeds. Dairy Fiber-fortified yogurt. Beverages Fiber-fortified soy milk. Fiber-fortified orange juice. Other Fiber bars. The items listed above may not be a complete list of recommended foods or beverages. Contact your dietitian for more options. What foods are not recommended? Grains White bread. Pasta made with refined flour. White rice. Vegetables Fried potatoes. Canned vegetables. Well-cooked vegetables. Fruits Fruit juice. Cooked, strained fruit. Meats and Other Protein Sources Fatty cuts of meat. Fried Sales executive or fried fish. Dairy Milk. Yogurt. Cream cheese. Sour cream. Beverages Soft drinks. Other Cakes and pastries. Butter and oils. The items listed above may not be a complete list of foods and beverages to avoid. Contact your dietitian for more information. What are some tips for including high-fiber foods in my diet?  Eat a wide variety of high-fiber foods.  Make sure that half of all grains consumed each day are whole grains.  Replace breads and cereals made from refined flour or white flour with whole-grain breads and cereals.  Replace white rice with brown rice, bulgur wheat, or millet.  Start the  day with a breakfast that is high in fiber, such as a cereal that contains at least 5 grams of fiber per serving.  Use beans in place of meat in soups, salads, or pasta.  Eat high-fiber snacks, such as berries, raw vegetables, nuts, or popcorn. This information is not intended to replace advice given to you by your health care provider. Make sure you discuss any questions you have with your health care provider. Document Released: 03/04/2005 Document Revised: 08/10/2015 Document Reviewed: 08/17/2013 Elsevier Interactive Patient Education  [employer].

## 2017-11-20 NOTE — Telephone Encounter (Signed)
Copied from [place]. Topic: Quick Communication - See Telephone Encounter >> Nov 20, 2017  5:30 PM Rutherford Nail, [place] wrote: CRM for notification. See Telephone encounter for: 11/20/17. Brianna with [facility] calling and states that they are needing clarification on the instructions for omeprazole (PRILOSEC) 40 MG capsule  CB#: [id number]

## 2017-11-21 LAB — HEPATITIS B SURFACE ANTIBODY,QUALITATIVE: HEP B S AB: REACTIVE — AB

## 2017-11-21 NOTE — Telephone Encounter (Signed)
Called [facility] to let them know medication is taken once daily

## 2018-08-20 ENCOUNTER — Ambulatory Visit (INDEPENDENT_AMBULATORY_CARE_PROVIDER_SITE_OTHER): Payer: Managed Care, Other (non HMO) | Admitting: Internal Medicine

## 2018-08-20 ENCOUNTER — Encounter: Payer: Self-pay | Admitting: Internal Medicine

## 2018-08-20 DIAGNOSIS — R21 Rash and other nonspecific skin eruption: Secondary | ICD-10-CM | POA: Insufficient documentation

## 2018-08-20 MED ORDER — TRIAMCINOLONE ACETONIDE 0.1 % EX CREA: 1.0000 "application " | TOPICAL_CREAM | Freq: Two times a day (BID) | CUTANEOUS | 0 refills | Status: DC

## 2018-08-20 NOTE — Progress Notes (Signed)
Virtual Visit via Video Note  I connected with Dustin Warren on 08/20/18 at  8:20 AM EDT by a video enabled telemedicine application and verified that I am speaking with the correct person using two identifiers.  The patient and the provider were at separate locations throughout the entire encounter.   I discussed the limitations of evaluation and management by telemedicine and the availability of in person appointments. The patient expressed understanding and agreed to proceed.  History of Present Illness: The patient is a 33 y.o. man with visit for bite and pain on leg. Right leg and happened on Sunday while mowing. He suspects mosquito bite. He denies scratching much but does wear work boots which have been rubbing all week. No pain now but some itching. He used benadryl cream otc which helped with itching. Started 4-5 days ago. Has no fevers or chills. Denies SOB or cough or red rash on his leg. Overall it is stable. Has tried cream otc  Observations/Objective: Appearance: normal, breathing appears normal, bite on right leg without surrounding erythema or central clearing, casual grooming, abdomen does not appear distended, throat normal, mental status is A and O times 3  Assessment and Plan: See problem oriented charting  Follow Up Instructions: rx triamcinolone ointment  I discussed the assessment and treatment plan with the patient. The patient was provided an opportunity to ask questions and all were answered. The patient agreed with the plan and demonstrated an understanding of the instructions.   The patient was advised to call back or seek an in-person evaluation if the symptoms worsen or if the condition fails to improve as anticipated.  Hoyt Koch, MD

## 2018-08-20 NOTE — Assessment & Plan Note (Signed)
Rx triamcinolone ointment and avoid scratching or shoes which rub on that area. No signs of infection or cellulitis.

## 2018-12-01 ENCOUNTER — Encounter: Payer: Self-pay | Admitting: Internal Medicine

## 2018-12-01 ENCOUNTER — Ambulatory Visit (INDEPENDENT_AMBULATORY_CARE_PROVIDER_SITE_OTHER): Payer: Managed Care, Other (non HMO) | Admitting: Internal Medicine

## 2018-12-01 ENCOUNTER — Other Ambulatory Visit: Payer: Self-pay

## 2018-12-01 ENCOUNTER — Ambulatory Visit: Payer: Managed Care, Other (non HMO) | Admitting: Internal Medicine

## 2018-12-01 VITALS — BP 134/90 | HR 85 | Temp 98.4°F | Ht 68.0 in | Wt 207.0 lb

## 2018-12-01 DIAGNOSIS — K649 Unspecified hemorrhoids: Secondary | ICD-10-CM

## 2018-12-01 DIAGNOSIS — Z23 Encounter for immunization: Secondary | ICD-10-CM

## 2018-12-01 MED ORDER — HYDROCORTISONE (PERIANAL) 2.5 % EX CREA: 1.0000 "application " | TOPICAL_CREAM | Freq: Two times a day (BID) | CUTANEOUS | 0 refills | Status: DC

## 2018-12-01 NOTE — Progress Notes (Signed)
   Subjective:   Patient ID: Dustin Warren, male    DOB: 1985/06/11, 33 y.o.   MRN: [id number]  HPI The patient is a 33 YO man coming in for concerns about hemorrhoids. Saturday he had a BM and there was some blood and pain with wiping. After that he has had some pain with wiping and something protruding. He has not had hemorrhoids before. Had colonoscopy 2019 with 2 polyps and due for follow up 2024. Denies fevers or chills. Denies abdominal pain or bloating. Denies constipation or diarrhea. No dark BM or black BM. Takes omeprazole daily and no NSAIDs recently.   Review of Systems  Constitutional: Negative.   HENT: Negative.   Eyes: Negative.   Respiratory: Negative for cough, chest tightness and shortness of breath.   Cardiovascular: Negative for chest pain, palpitations and leg swelling.  Gastrointestinal: Positive for anal bleeding and rectal pain. Negative for abdominal distention, abdominal pain, blood in stool, constipation, diarrhea, nausea and vomiting.  Musculoskeletal: Negative.   Skin: Negative.   Neurological: Negative.   Psychiatric/Behavioral: Negative.     Objective:  Physical Exam Constitutional:      Appearance: He is well-developed.  HENT:     Head: Normocephalic and atraumatic.  Neck:     Musculoskeletal: Normal range of motion.  Cardiovascular:     Rate and Rhythm: Normal rate and regular rhythm.  Pulmonary:     Effort: Pulmonary effort is normal. No respiratory distress.     Breath sounds: Normal breath sounds. No wheezing or rales.  Abdominal:     General: Bowel sounds are normal. There is no distension.     Palpations: Abdomen is soft.     Tenderness: There is no abdominal tenderness. There is no rebound.  Genitourinary:    Comments: 1 external hemorrhoids small about 5 o'clock Skin:    General: Skin is warm and dry.  Neurological:     Mental Status: He is alert and oriented to person, place, and time.     Coordination: Coordination normal.      Vitals:   12/01/18 1403  BP: 134/90  Pulse: 85  Temp: 98.4 F (36.9 C)  TempSrc: Oral  SpO2: 98%  Weight: 207 lb (93.9 kg)  Height: 5\' 8"  (1.727 m)    Assessment & Plan:  Flu shot given at visit

## 2018-12-01 NOTE — Patient Instructions (Signed)
We have sent in a cream to use twice a day for the next 1-2 weeks.

## 2018-12-01 NOTE — Assessment & Plan Note (Signed)
Small external hemorrhoid. No noticed on 2019 colonoscopy. Anusol rx done today and bowel hygiene discussed as well as fiber to help avoid constipation.

## 2018-12-04 ENCOUNTER — Encounter: Payer: Managed Care, Other (non HMO) | Admitting: Internal Medicine

## 2018-12-08 ENCOUNTER — Telehealth: Payer: Self-pay | Admitting: Internal Medicine

## 2018-12-08 DIAGNOSIS — K209 Esophagitis, unspecified without bleeding: Secondary | ICD-10-CM

## 2018-12-08 MED ORDER — OMEPRAZOLE 40 MG PO CPDR: 40.0000 mg | DELAYED_RELEASE_CAPSULE | Freq: Every day | ORAL | 1 refills | Status: DC

## 2018-12-08 NOTE — Telephone Encounter (Signed)
Sent!

## 2018-12-08 NOTE — Telephone Encounter (Signed)
rx refill omeprazole (PRILOSEC) [facility] [phone] - [place], [place] - [address] AT [address] [phone] (Phone) [phone] (Fax)

## 2018-12-09 ENCOUNTER — Other Ambulatory Visit: Payer: Self-pay

## 2018-12-09 ENCOUNTER — Other Ambulatory Visit (INDEPENDENT_AMBULATORY_CARE_PROVIDER_SITE_OTHER): Payer: Managed Care, Other (non HMO)

## 2018-12-09 ENCOUNTER — Telehealth: Payer: Self-pay | Admitting: Internal Medicine

## 2018-12-09 ENCOUNTER — Encounter: Payer: Self-pay | Admitting: Internal Medicine

## 2018-12-09 ENCOUNTER — Ambulatory Visit (INDEPENDENT_AMBULATORY_CARE_PROVIDER_SITE_OTHER): Payer: Managed Care, Other (non HMO) | Admitting: Internal Medicine

## 2018-12-09 VITALS — BP 110/80 | HR 73 | Temp 98.4°F | Ht 68.0 in | Wt 207.0 lb

## 2018-12-09 DIAGNOSIS — K209 Esophagitis, unspecified without bleeding: Secondary | ICD-10-CM

## 2018-12-09 DIAGNOSIS — Z0000 Encounter for general adult medical examination without abnormal findings: Secondary | ICD-10-CM

## 2018-12-09 LAB — COMPREHENSIVE METABOLIC PANEL
ALT: 58 U/L — ABNORMAL HIGH (ref 0–53)
AST: 29 U/L (ref 0–37)
Albumin: 4.8 g/dL (ref 3.5–5.2)
Alkaline Phosphatase: 106 U/L (ref 39–117)
BUN: 13 mg/dL (ref 6–23)
CO2: 27 mEq/L (ref 19–32)
Calcium: 9.8 mg/dL (ref 8.4–10.5)
Chloride: 98 mEq/L (ref 96–112)
Creatinine, Ser: 1.1 mg/dL (ref 0.40–1.50)
GFR: 76.9 mL/min (ref >60.00)
Glucose, Bld: 101 mg/dL — ABNORMAL HIGH (ref 70–99)
Potassium: 4.5 mEq/L (ref 3.5–5.1)
Sodium: 136 mEq/L (ref 135–145)
Total Bilirubin: 0.6 mg/dL (ref 0.2–1.2)
Total Protein: 7.8 g/dL (ref 6.0–8.3)

## 2018-12-09 LAB — CBC
HCT: 47 % (ref 39.0–52.0)
Hemoglobin: 16.3 g/dL (ref 13.0–17.0)
MCHC: 34.7 g/dL (ref 30.0–36.0)
MCV: 85.9 fl (ref 78.0–100.0)
Platelets: 249 10*3/uL (ref 150.0–400.0)
RBC: 5.46 Mil/uL (ref 4.22–5.81)
RDW: 12.9 % (ref 11.5–15.5)
WBC: 5.3 10*3/uL (ref 4.0–10.5)

## 2018-12-09 LAB — LIPID PANEL
Cholesterol: 175 mg/dL (ref 0–200)
HDL: 39.8 mg/dL (ref >39.00)
LDL Cholesterol: 104 mg/dL — ABNORMAL HIGH (ref 0–99)
NonHDL: 134.77
Total CHOL/HDL Ratio: 4
Triglycerides: 156 mg/dL — ABNORMAL HIGH (ref 0.0–149.0)
VLDL: 31.2 mg/dL (ref 0.0–40.0)

## 2018-12-09 LAB — HEMOGLOBIN A1C: Hgb A1c MFr Bld: 5.7 % (ref 4.6–6.5)

## 2018-12-09 LAB — FERRITIN: Ferritin: 19.6 ng/mL — ABNORMAL LOW (ref 22.0–322.0)

## 2018-12-09 MED ORDER — PANTOPRAZOLE SODIUM 40 MG PO TBEC: 40.0000 mg | DELAYED_RELEASE_TABLET | Freq: Two times a day (BID) | ORAL | 3 refills | Status: DC

## 2018-12-09 MED ORDER — OMEPRAZOLE 40 MG PO CPDR: 40.0000 mg | DELAYED_RELEASE_CAPSULE | Freq: Every day | ORAL | 3 refills | Status: DC

## 2018-12-09 MED ORDER — PANTOPRAZOLE SODIUM 40 MG PO TBEC: 40.0000 mg | DELAYED_RELEASE_TABLET | Freq: Every day | ORAL | 3 refills | Status: DC

## 2018-12-09 NOTE — Progress Notes (Signed)
   Subjective:   Patient ID: Dustin Warren, male    DOB: Mar 03, 1986, 33 y.o.   MRN: [id number]  HPI The patient is a 33 YO man coming in for physical.   PMH, [place], social history reviewed and updated  Review of Systems  Constitutional: Negative.   HENT: Negative.   Eyes: Negative.   Respiratory: Negative for cough, chest tightness and shortness of breath.   Cardiovascular: Negative for chest pain, palpitations and leg swelling.  Gastrointestinal: Negative for abdominal distention, abdominal pain, constipation, diarrhea, nausea and vomiting.  Musculoskeletal: Negative.   Skin: Negative.   Neurological: Negative.   Psychiatric/Behavioral: Negative.     Objective:  Physical Exam Constitutional:      Appearance: He is well-developed.  HENT:     Head: Normocephalic and atraumatic.  Neck:     Musculoskeletal: Normal range of motion.  Cardiovascular:     Rate and Rhythm: Normal rate and regular rhythm.  Pulmonary:     Effort: Pulmonary effort is normal. No respiratory distress.     Breath sounds: Normal breath sounds. No wheezing or rales.  Abdominal:     General: Bowel sounds are normal. There is no distension.     Palpations: Abdomen is soft.     Tenderness: There is no abdominal tenderness. There is no rebound.  Skin:    General: Skin is warm and dry.  Neurological:     Mental Status: He is alert and oriented to person, place, and time.     Coordination: Coordination normal.     Vitals:   12/09/18 0848  BP: 110/80  Pulse: 73  Temp: 98.4 F (36.9 C)  TempSrc: Oral  SpO2: 98%  Weight: 207 lb (93.9 kg)  Height: 5\' 8"  (1.727 m)    Assessment & Plan:

## 2018-12-09 NOTE — Patient Instructions (Signed)

## 2018-12-09 NOTE — Telephone Encounter (Signed)
Pt just left office and needs clarification. Pt was taking omeprazole 40 mg once a day and received new medication pantoprazole and this med directions is to take 40 mg twice a day

## 2018-12-09 NOTE — Telephone Encounter (Signed)
Sent in error, sent in protonix 40 mg 1 pill daily

## 2018-12-09 NOTE — Telephone Encounter (Signed)
Patient informed of MD response  

## 2018-12-09 NOTE — Assessment & Plan Note (Signed)
Flu shot up to date. Tetanus up to date. Colonoscopy up to date. Counseled about sun safety and mole surveillance. Counseled about the dangers of distracted driving. Given 10 year screening recommendations.

## 2018-12-10 NOTE — Telephone Encounter (Signed)
Pt called and stated that the insurance company is paying for medication. Pt would like to know if he can get a PA. Please advise

## 2018-12-15 NOTE — Telephone Encounter (Signed)
Called patient informed him that unfortunately this medication is one that is excluded form his plan and he would have to cover at 100% or until deductible is met. Patient is going to call insurance to see what exactly they cover. Did inform him there are savings cards online and we have goodrx cards here if he would like to try that as well patient stated understanding

## 2019-06-12 ENCOUNTER — Ambulatory Visit: Payer: Managed Care, Other (non HMO) | Attending: Internal Medicine

## 2019-06-12 DIAGNOSIS — Z23 Encounter for immunization: Secondary | ICD-10-CM

## 2019-06-12 NOTE — Progress Notes (Signed)
   Covid-19 Vaccination Clinic  Name:  Jairius Arseneau    MRN: [id number] DOB: 09-Jan-1986  06/12/2019  Mr. Morath was observed post Covid-19 immunization for 15 minutes without incident. He was provided with Vaccine Information Sheet and instruction to access the V-Safe system.   Mr. Lapitan was instructed to call 911 with any severe reactions post vaccine: Marland Kitchen Difficulty breathing  . Swelling of face and throat  . A fast heartbeat  . A bad rash all over body  . Dizziness and weakness   Immunizations Administered    Name Date Dose VIS Date Route   Pfizer COVID-19 Vaccine 06/12/2019  4:30 PM 0.3 mL 02/26/2019 Intramuscular   Manufacturer: [place]   Lot: [id number]   [place]: [id number]

## 2019-07-06 ENCOUNTER — Ambulatory Visit: Payer: Managed Care, Other (non HMO) | Attending: Internal Medicine

## 2019-07-06 DIAGNOSIS — Z23 Encounter for immunization: Secondary | ICD-10-CM

## 2019-07-06 NOTE — Progress Notes (Signed)
   Covid-19 Vaccination Clinic  Name:  Dustin Warren    MRN: [id number] DOB: 10-20-1985  07/06/2019  [name] was observed post Covid-19 immunization for 15 minutes without incident. He was provided with Vaccine Information Sheet and instruction to access the V-Safe system.   [name] was instructed to call 911 with any severe reactions post vaccine: Marland Kitchen Difficulty breathing  . Swelling of face and throat  . A fast heartbeat  . A bad rash all over body  . Dizziness and weakness   Immunizations Administered    Name Date Dose VIS Date Route   Pfizer COVID-19 Vaccine 07/06/2019  2:25 PM 0.3 mL 05/12/2018 Intramuscular   Manufacturer: [place]   Lot: [id number]   [place]: [id number]

## 2019-08-10 ENCOUNTER — Other Ambulatory Visit: Payer: Self-pay

## 2019-08-10 ENCOUNTER — Encounter: Payer: Self-pay | Admitting: Internal Medicine

## 2019-08-10 ENCOUNTER — Ambulatory Visit (INDEPENDENT_AMBULATORY_CARE_PROVIDER_SITE_OTHER): Payer: Managed Care, Other (non HMO) | Admitting: Internal Medicine

## 2019-08-10 DIAGNOSIS — M79602 Pain in left arm: Secondary | ICD-10-CM

## 2019-08-10 MED ORDER — MELOXICAM 7.5 MG PO TABS: 7.5000 mg | ORAL_TABLET | Freq: Every day | ORAL | 0 refills | Status: DC

## 2019-08-10 NOTE — Patient Instructions (Signed)
We have sent in meloxicam to take 1 pill daily for 1-2 weeks.   If this does not help let us know and we can check further.

## 2019-08-10 NOTE — Progress Notes (Signed)
   Subjective:   Patient ID: Dustin Warren, male    DOB: Mar 02, 1986, 34 y.o.   MRN: [id number]  HPI The patient is a 35 YO man coming in for left shoulder problem. Denies injury or overuse, having some pain in the shoulder about 1-2/10. Does occasionally have tingling in the arm. Injury to left hand yesterday at work and went to urgent care for x-ray and stitches (not broken, had Tdap). Overall it is not improving and started about a week.   Review of Systems  Constitutional: Negative.   HENT: Negative.   Eyes: Negative.   Respiratory: Negative for cough, chest tightness and shortness of breath.   Cardiovascular: Negative for chest pain, palpitations and leg swelling.  Gastrointestinal: Negative for abdominal distention, abdominal pain, constipation, diarrhea, nausea and vomiting.  Musculoskeletal: Positive for myalgias.  Skin: Negative.   Neurological: Negative.   Psychiatric/Behavioral: Negative.     Objective:  Physical Exam Constitutional:      Appearance: He is well-developed.  HENT:     Head: Normocephalic and atraumatic.  Cardiovascular:     Rate and Rhythm: Normal rate and regular rhythm.  Pulmonary:     Effort: Pulmonary effort is normal. No respiratory distress.     Breath sounds: Normal breath sounds. No wheezing or rales.  Abdominal:     General: Bowel sounds are normal. There is no distension.     Palpations: Abdomen is soft.     Tenderness: There is no abdominal tenderness. There is no rebound.  Musculoskeletal:     Cervical back: Normal range of motion.     Comments: Full ROM, no pain with ROM left shoulder, no pain on palpation AC joint  Skin:    General: Skin is warm and dry.  Neurological:     Mental Status: He is alert and oriented to person, place, and time.     Coordination: Coordination normal.     Vitals:   08/10/19 0812  BP: (!) 142/82  Pulse: 72  Temp: 98.9 F (37.2 C)  SpO2: 99%  Weight: 214 lb (97.1 kg)  Height: 5\' 8"  (1.727 m)     This visit occurred during the SARS-CoV-2 public health emergency.  Safety protocols were in place, including screening questions prior to the visit, additional usage of staff PPE, and extensive cleaning of exam room while observing appropriate contact time as indicated for disinfecting solutions.   Assessment & Plan:

## 2019-08-10 NOTE — Assessment & Plan Note (Signed)
Rx 2 week meloxicam to help. If no resolution let us know.

## 2019-08-27 ENCOUNTER — Other Ambulatory Visit: Payer: Self-pay

## 2019-08-27 ENCOUNTER — Encounter: Payer: Self-pay | Admitting: Internal Medicine

## 2019-08-27 ENCOUNTER — Ambulatory Visit (INDEPENDENT_AMBULATORY_CARE_PROVIDER_SITE_OTHER): Payer: Managed Care, Other (non HMO) | Admitting: Internal Medicine

## 2019-08-27 VITALS — BP 130/80 | HR 73 | Temp 99.2°F | Ht 68.0 in | Wt 208.0 lb

## 2019-08-27 DIAGNOSIS — M25512 Pain in left shoulder: Secondary | ICD-10-CM | POA: Diagnosis not present

## 2019-08-27 NOTE — Assessment & Plan Note (Signed)
Failed meloxicam and conservative management. Refer to sports medicine to assess for impingement or bone spur causing pain and treat as appropriate.

## 2019-08-27 NOTE — Progress Notes (Signed)
   Subjective:   Patient ID: Dustin Warren, male    DOB: May 26, 1985, 34 y.o.   MRN: [id number]  HPI The patient is a 34 YO man coming in for persistent left shoulder pain. 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 He gets pain in the arm with it hanging down at his side. No pain when h is propping it up on the arm rest while driving. Denies numbness in the arm or weakness. Pain is worsening gradually now 2-3/10. Tried meloxicam and did not help. Left hand healed up fine. Pain does radiate from the left shoulder into the left elbow but not into the hand. Still no limitation of ROM  Review of Systems  Constitutional: Negative.   HENT: Negative.   Eyes: Negative.   Respiratory: Negative for cough, chest tightness and shortness of breath.   Cardiovascular: Negative for chest pain, palpitations and leg swelling.  Gastrointestinal: Negative for abdominal distention, abdominal pain, constipation, diarrhea, nausea and vomiting.  Musculoskeletal: Positive for arthralgias and myalgias.  Skin: Negative.   Neurological: Negative.   Psychiatric/Behavioral: Negative.     Objective:  Physical Exam Constitutional:      Appearance: He is well-developed.  HENT:     Head: Normocephalic and atraumatic.  Cardiovascular:     Rate and Rhythm: Normal rate and regular rhythm.  Pulmonary:     Effort: Pulmonary effort is normal. No respiratory distress.     Breath sounds: Normal breath sounds. No wheezing or rales.  Abdominal:     General: Bowel sounds are normal. There is no distension.     Palpations: Abdomen is soft.     Tenderness: There is no abdominal tenderness. There is no rebound.  Musculoskeletal:        General: Tenderness present.     Cervical back: Normal range of motion.     Comments: Tenderness in the left axillary and AC region. No numbness or weakness. Full ROM.   Skin:    General: Skin is warm and dry.  Neurological:     Mental Status: He is alert and  oriented to person, place, and time.     Coordination: Coordination normal.     Vitals:   08/27/19 0844  BP: 130/80  Pulse: 73  Temp: 99.2 F (37.3 C)  TempSrc: Oral  SpO2: 97%  Weight: 208 lb (94.3 kg)  Height: 5\' 8"  (1.727 m)    This visit occurred during the SARS-CoV-2 public health emergency.  Safety protocols were in place, including screening questions prior to the visit, additional usage of staff PPE, and extensive cleaning of exam room while observing appropriate contact time as indicated for disinfecting solutions.   Assessment & Plan:

## 2019-08-27 NOTE — Patient Instructions (Signed)
We will get you in with sports medicine to fix the shoulder.

## 2019-08-31 ENCOUNTER — Other Ambulatory Visit: Payer: Self-pay

## 2019-08-31 ENCOUNTER — Ambulatory Visit: Payer: Managed Care, Other (non HMO) | Admitting: Family Medicine

## 2019-08-31 ENCOUNTER — Ambulatory Visit: Payer: Self-pay

## 2019-08-31 ENCOUNTER — Encounter: Payer: Self-pay | Admitting: Family Medicine

## 2019-08-31 ENCOUNTER — Ambulatory Visit (INDEPENDENT_AMBULATORY_CARE_PROVIDER_SITE_OTHER): Payer: Managed Care, Other (non HMO)

## 2019-08-31 VITALS — BP 104/74 | HR 85 | Ht 68.0 in | Wt 214.2 lb

## 2019-08-31 DIAGNOSIS — M79602 Pain in left arm: Secondary | ICD-10-CM

## 2019-08-31 DIAGNOSIS — M25512 Pain in left shoulder: Secondary | ICD-10-CM

## 2019-08-31 MED ORDER — GABAPENTIN 300 MG PO CAPS
300.0000 mg | ORAL_CAPSULE | Freq: Three times a day (TID) | ORAL | 3 refills | Status: DC | PRN
Start: 2019-08-31 — End: 2020-06-06

## 2019-08-31 MED ORDER — PREDNISONE 50 MG PO TABS: 50.0000 mg | ORAL_TABLET | Freq: Every day | ORAL | 0 refills | Status: DC

## 2019-08-31 NOTE — Progress Notes (Signed)
Subjective:    I'm seeing this patient as a consultation for:  L shoulder pain. Note will be routed back to referring provider/PCP.  CC: L shoulder pain  I, Molly Weber, LAT, ATC, am serving as scribe for Dr. Lynne Leader.  HPI: Pt is a 34 y/o male presenting w/ c/o L shoulder and arm pain x 2-3 weeks.  He locates his pain to his L post-lat shoulder that runs down his L upper arm and occasionally into his L hand.  He drives a trash truck for work.  He rates his pain at a 1-2/10.  Radiating pain: yes from shoulder to L elbow Neck pain: No L UE numbness/tingling: yes Mechanical symptoms: No Aggravating factors: laying on his L side;  Treatments tried: Meloxicam  Past medical history, Surgical history, Family history, Social history, Allergies, and medications have been entered into the medical record, reviewed.   Review of Systems: No new headache, visual changes, nausea, vomiting, diarrhea, constipation, dizziness, abdominal pain, skin rash, fevers, chills, night sweats, weight loss, swollen lymph nodes, body aches, joint swelling, muscle aches, chest pain, shortness of breath, mood changes, visual or auditory hallucinations.   Objective:    Vitals:   08/31/19 0833  BP: 104/74  Pulse: 85  SpO2: 99%   General: Well Developed, well nourished, and in no acute distress.  Neuro/Psych: Alert and oriented x3, extra-ocular muscles intact, able to move all 4 extremities, sensation grossly intact. Skin: Warm and dry, no rashes noted.  Respiratory: Not using accessory muscles, speaking in full sentences, trachea midline.  Cardiovascular: Pulses palpable, no extremity edema. Abdomen: Does not appear distended. MSK: C-spine: Normal-appearing nontender normal cervical motion.  Positive left-sided Spurling's test. Upper extremity strength is equal and normal throughout except left tricep extension which is diminished 4+/5. Reflexes and sensation are equal normal throughout. Pulses cap refill  and sensation are intact distally.  Left shoulder normal-appearing nontender normal shoulder motion and strength. Negative impingement testing.  Lab and Radiology Results X-ray cervical spine images obtained today personally and independently reviewed Normal-appearing with no significant malalignment or significant degenerative changes.  No fractures. Await formal radiology review  Impression and Recommendations:    Assessment and Plan: 34 y.o. male with left arm pain ongoing for 3 weeks now.  Patient has slight weakness to left tricep extension which corresponds to C7 nerve root.  This does correspond a bit to his pain.  He does not have any worsening pain with shoulder motion and does have worsening pain with neck motion indicating cervical radiculopathy.  X-ray today looks pretty normal however radiology overread is still pending.  Plan for prednisone and gabapentin.  If not improving patient will notify me and likely will proceed to MRI for epidural steroid injection planning.Marland Kitchen  PDMP not reviewed this encounter. Orders Placed This Encounter  Procedures   DG Cervical Spine 2 or 3 views    Standing Status:   Future    Number of Occurrences:   1    Standing Expiration Date:   08/30/2020    Order Specific Question:   Reason for Exam (SYMPTOM  OR DIAGNOSIS REQUIRED)    Answer:   eval possible left c7 or C8 radiculopathy    Order Specific Question:   Preferred imaging location?    Answer:   [facility]    Order Specific Question:   Radiology Contrast Protocol - do NOT remove file path    Answer:   [email or web address]   Meds ordered this encounter  Medications  predniSONE (DELTASONE) 50 MG tablet    Sig: Take 1 tablet (50 mg total) by mouth daily.    Dispense:  5 tablet    Refill:  0   gabapentin (NEURONTIN) 300 MG capsule    Sig: Take 1 capsule (300 mg total) by mouth 3 (three) times daily as needed (nerve pain).    Dispense:  90  capsule    Refill:  3    Discussed warning signs or symptoms. Please see discharge instructions. Patient expresses understanding.   The above documentation has been reviewed and is accurate and complete Lynne Leader, M.D.

## 2019-08-31 NOTE — Patient Instructions (Signed)
Thank you for coming in today. I think this is a pinched nerve in your neck (cervical radiculopathy).  Plan for prednisone and gabapentin.  Get xray today.  If not improving let me know and we can proceed to MRI soon.  Take gabapentin up to 3x daily for nerve pain but it may make you sleepy so take it mostly at night.   Come back or go to the emergency room if you notice new weakness new numbness problems walking or bowel or bladder problems.   Cervical Radiculopathy  Cervical radiculopathy means that a nerve in the neck (a cervical nerve) is pinched or bruised. This can happen because of an injury to the cervical spine (vertebrae) in the neck, or as a normal part of getting older. This can cause pain or loss of feeling (numbness) that runs from your neck all the way down to your arm and fingers. Often, this condition gets better with rest. Treatment may be needed if the condition does not get better. What are the causes?  A neck injury.  A bulging disk in your spine.  Muscle movements that you cannot control (muscle spasms).  Tight muscles in your neck due to overuse.  Arthritis.  Breakdown in the bones and joints of the spine (spondylosis) due to getting older.  Bone spurs that form near the nerves in the neck. What are the signs or symptoms?  Pain. The pain may: ? Run from the neck to the arm and hand. ? Be very bad or irritating. ? Be worse when you move your neck.  Loss of feeling or tingling in your arm or hand.  Weakness in your arm or hand, in very bad cases. How is this treated? In many cases, treatment is not needed for this condition. With rest, the condition often gets better over time. If treatment is needed, options may include:  Wearing a soft neck collar (cervical collar) for short periods of time, as told by your doctor.  Doing exercises (physical therapy) to strengthen your neck muscles.  Taking medicines.  Having shots (injections) in your spine, in  very bad cases.  Having surgery. This may be needed if other treatments do not help. The type of surgery that is used depends on the cause of your condition. Follow these instructions at home: If you have a soft neck collar:  Wear it as told by your doctor. Remove it only as told by your doctor.  Ask your doctor if you can remove the collar for cleaning and bathing. If you are allowed to remove the collar for cleaning or bathing: ? Follow instructions from your doctor about how to remove the collar safely. ? Clean the collar by wiping it with mild soap and water and drying it completely. ? Take out any removable pads in the collar every 1-2 days. Wash them by hand with soap and water. Let them air-dry completely before you put them back in the collar. ? Check your skin under the collar for redness or sores. If you see any, tell your doctor. Managing pain      Take over-the-counter and prescription medicines only as told by your doctor.  If told, put ice on the painful area. ? If you have a soft neck collar, remove it as told by your doctor. ? Put ice in a plastic bag. ? Place a towel between your skin and the bag. ? Leave the ice on for 20 minutes, 2-3 times a day.  If using ice does  not help, you can try using heat. Use the heat source that your doctor recommends, such as a moist heat pack or a heating pad. ? Place a towel between your skin and the heat source. ? Leave the heat on for 20-30 minutes. ? Remove the heat if your skin turns bright red. This is very important if you are unable to feel pain, heat, or cold. You may have a greater risk of getting burned.  You may try a gentle neck and shoulder rub (massage). Activity  Rest as needed.  Return to your normal activities as told by your doctor. Ask your doctor what activities are safe for you.  Do exercises as told by your doctor or physical therapist.  Do not lift anything that is heavier than 10 lb (4.5 kg) until your  doctor tells you that it is safe. General instructions  Use a flat pillow when you sleep.  Do not drive while wearing a soft neck collar. If you do not have a soft neck collar, ask your doctor if it is safe to drive while your neck heals.  Ask your doctor if the medicine prescribed to you requires you to avoid driving or using heavy machinery.  Do not use any products that contain nicotine or tobacco, such as cigarettes, e-cigarettes, and chewing tobacco. These can delay healing. If you need help quitting, ask your doctor.  Keep all follow-up visits as told by your doctor. This is important. Contact a doctor if:  Your condition does not get better with treatment. Get help right away if:  Your pain gets worse and is not helped with medicine.  You lose feeling or feel weak in your hand, arm, face, or leg.  You have a high fever.  You have a stiff neck.  You cannot control when you poop or pee (have incontinence).  You have trouble with walking, balance, or talking. Summary  Cervical radiculopathy means that a nerve in the neck is pinched or bruised.  A nerve can get pinched from a bulging disk, arthritis, an injury to the neck, or other causes.  Symptoms include pain, tingling, or loss of feeling that goes from the neck into the arm or hand.  Weakness in your arm or hand can happen in very bad cases.  Treatment may include resting, wearing a soft neck collar, and doing exercises. You might need to take medicines for pain. In very bad cases, shots or surgery may be needed. This information is not intended to replace advice given to you by your health care provider. Make sure you discuss any questions you have with your health care provider. Document Revised: 01/23/2018 Document Reviewed: 01/23/2018 Elsevier Patient Education  2020 [employer].

## 2019-08-31 NOTE — Progress Notes (Signed)
X-ray cervical spine looks normal to radiology.  If not improving with the medications I prescribed let me know we will proceed to MRI.

## 2019-09-07 ENCOUNTER — Telehealth: Payer: Self-pay | Admitting: Family Medicine

## 2019-09-07 DIAGNOSIS — R29898 Other symptoms and signs involving the musculoskeletal system: Secondary | ICD-10-CM

## 2019-09-07 DIAGNOSIS — M5412 Radiculopathy, cervical region: Secondary | ICD-10-CM

## 2019-09-07 NOTE — Telephone Encounter (Signed)
Plan to proceed with cervical spine MRI.  I have ordered this at [facility] is that usually the fastest place to get it done.  You should hear soon about scheduling once the MRI is approved.  Schedule follow-up with me after MRI is done to review results and discuss next steps and treatment plans.  If symptoms worsen significantly I can prescribe prednisone again but if we can avoid it that would probably be a good idea.

## 2019-09-07 NOTE — Telephone Encounter (Signed)
Patient called stating that he just completed the round of Prednisone on Sunday and began to notice last night that his arm feels tingly and painful. It has continued this morning and seems worse than before. While on the Prednisone, he felt much better but is concerned that his symptoms have returned. He did not know if you would like for him to take another round of Prednisone or proceed with the MRI?  Please advise.

## 2019-09-07 NOTE — Telephone Encounter (Signed)
Called pt and relayed Dr. Clovis Riley message regarding c-spine MRI.  Pt verbalized understanding.

## 2019-09-17 ENCOUNTER — Telehealth: Payer: Self-pay | Admitting: Family Medicine

## 2019-09-17 NOTE — Telephone Encounter (Signed)
MRI was denied. Fundamentally although you are worsening we need to document improvement in in face-to-face office visit in order for the MRI to be approved.  Schedule follow-up with me this upcoming week to review and update physical exam findings.  At that point should be able to get MRI approved.

## 2019-09-22 NOTE — Telephone Encounter (Signed)
Appt made for 09/23/2019

## 2019-09-23 ENCOUNTER — Ambulatory Visit: Payer: Managed Care, Other (non HMO) | Admitting: Family Medicine

## 2019-09-23 ENCOUNTER — Other Ambulatory Visit: Payer: Self-pay

## 2019-09-23 VITALS — BP 124/82 | HR 85 | Ht 68.0 in | Wt 209.0 lb

## 2019-09-23 DIAGNOSIS — M5412 Radiculopathy, cervical region: Secondary | ICD-10-CM

## 2019-09-23 DIAGNOSIS — R29898 Other symptoms and signs involving the musculoskeletal system: Secondary | ICD-10-CM | POA: Diagnosis not present

## 2019-09-23 NOTE — Progress Notes (Signed)
   I, Wendy Poet, LAT, ATC, am serving as scribe for Dr. Lynne Leader.  Dustin Warren is a 34 y.o. male who presents to [place] at [facility] today for f/u of L shoulder and arm pain that radiates from his L post-lat shoulder to his L upper arm and hand.  He was last seen by Dr. Georgina Snell on 08/31/19 and was prescribed prednisone and gabapentin.  Since his last visit, pt reports that he did have some relief with prednisone but pain came back after finishing course. Pain in thumb and 2nd and 3rd fingers. Continues to use gabapentin. Is having trouble sleeping.   Diagnostic testing: C-spine XR- 08/31/19  Pertinent review of systems: No fevers or chills  Relevant historical information: Erythema migrans history from Lyme disease   Exam:  BP 124/82   Pulse 85   Ht 5\' 8"  (1.727 m)   Wt 209 lb (94.8 kg)   SpO2 98%   BMI 31.78 kg/m  General: Well Developed, well nourished, and in no acute distress.   MSK: C-spine: Normal-appearing nontender normal motion. Positive left-sided Spurling's test. Upper extremity strength is intact with the exception of triceps extension which is diminished 3/5 left normal right. Sensation is intact distally.  Lab and Radiology Results DG Cervical Spine 2 or 3 views  Result Date: 08/31/2019 CLINICAL DATA:  Left arm pain and numbness, no known injury, initial encounter EXAM: CERVICAL SPINE - 3 VIEW COMPARISON:  None. FINDINGS: Seven cervical segments are well visualized. Vertebral body height is well maintained. No acute fracture or acute facet abnormality is noted. The odontoid is within normal limits. No soft tissue abnormality is seen. IMPRESSION: No acute abnormality noted. Electronically Signed   By: Inez Catalina M.D.   On: 08/31/2019 10:55   I, Lynne Leader, personally (independently) visualized and performed the interpretation of the images attached in this note.     Assessment and Plan: 34 y.o. male with left cervical radiculopathy  likely C7 dermatome based on triceps weakness and pain radiating to index and middle finger left hand.  Plan for MRI to evaluate progressive worsening neurological weakness.  Likely proceed with epidural steroid injection after MRI.   PDMP not reviewed this encounter. Orders Placed This Encounter  Procedures  . MR CERVICAL SPINE WO CONTRAST    Standing Status:   Future    Standing Expiration Date:   09/22/2020    Order Specific Question:   What is the patient's sedation requirement?    Answer:   No Sedation    Order Specific Question:   Does the patient have a pacemaker or implanted devices?    Answer:   No    Order Specific Question:   Preferred imaging location?    Answer:   Product/process development scientist (table limit-350lbs)    Order Specific Question:   Radiology Contrast Protocol - do NOT remove file path    Answer:   \\[email or web address]   No orders of the defined types were placed in this encounter.    Discussed warning signs or symptoms. Please see discharge instructions. Patient expresses understanding.   The above documentation has been reviewed and is accurate and complete Lynne Leader, M.D.

## 2019-09-23 NOTE — Patient Instructions (Signed)
Thank you for coming in today. Plan for MRI.  Will order epidural steroid injection after MRI.  I will offer a return visit after the MRI to go over results in detail.

## 2019-09-25 ENCOUNTER — Ambulatory Visit (INDEPENDENT_AMBULATORY_CARE_PROVIDER_SITE_OTHER): Payer: Managed Care, Other (non HMO)

## 2019-09-25 ENCOUNTER — Other Ambulatory Visit: Payer: Self-pay

## 2019-09-25 DIAGNOSIS — M5412 Radiculopathy, cervical region: Secondary | ICD-10-CM | POA: Diagnosis not present

## 2019-09-25 DIAGNOSIS — R29898 Other symptoms and signs involving the musculoskeletal system: Secondary | ICD-10-CM

## 2019-09-28 ENCOUNTER — Telehealth: Payer: Self-pay | Admitting: Family Medicine

## 2019-09-28 DIAGNOSIS — R29898 Other symptoms and signs involving the musculoskeletal system: Secondary | ICD-10-CM

## 2019-09-28 DIAGNOSIS — M5412 Radiculopathy, cervical region: Secondary | ICD-10-CM

## 2019-09-28 NOTE — Progress Notes (Signed)
MRI does show a bulging disc pressing on the left C7 nerve root.  This explains the radiating pain and weakness.  There is a bulging disc a bit higher up that does not look as bad.  This probably is not causing symptoms.  I have already ordered the epidural steroid injection.  Please call [facility] at [phone] to schedule.  Schedule follow-up with me if needed.

## 2019-09-28 NOTE — Telephone Encounter (Signed)
Epidural steroid injection ordered 

## 2019-10-05 ENCOUNTER — Other Ambulatory Visit: Payer: Self-pay

## 2019-10-05 ENCOUNTER — Ambulatory Visit
Admission: RE | Admit: 2019-10-05 | Discharge: 2019-10-05 | Disposition: A | Discharge status: Home / Self Care | Payer: Managed Care, Other (non HMO) | Source: Ambulatory Visit | Attending: Family Medicine | Admitting: Family Medicine

## 2019-10-05 DIAGNOSIS — R29898 Other symptoms and signs involving the musculoskeletal system: Secondary | ICD-10-CM

## 2019-10-05 DIAGNOSIS — M5412 Radiculopathy, cervical region: Secondary | ICD-10-CM

## 2019-10-05 MED ORDER — TRIAMCINOLONE ACETONIDE 40 MG/ML IJ SUSP (RADIOLOGY)
60.0000 mg | Freq: Once | INTRAMUSCULAR | Status: AC
  Administered 2019-10-05: 60 mg via EPIDURAL

## 2019-10-05 MED ORDER — IOPAMIDOL (ISOVUE-M 300) INJECTION 61%
1.0000 mL | Freq: Once | INTRAMUSCULAR | Status: AC
  Administered 2019-10-05: 1 mL via EPIDURAL

## 2019-10-05 NOTE — Discharge Instructions (Signed)

## 2019-10-15 ENCOUNTER — Telehealth: Payer: Self-pay | Admitting: Family Medicine

## 2019-10-15 NOTE — Telephone Encounter (Signed)
Pt had epidural 7/20. Arm/shoulder feel better but he is noticing a tingling his a few of his fingertips that is new.  Is this normal? Too soon? Does he need to reck with [place]?

## 2019-10-18 NOTE — Telephone Encounter (Signed)
If the tingling continues beyond a few weeks I would recommend a repeat epidural steroid injection. Let me know if you want me to order it again.

## 2019-10-18 NOTE — Telephone Encounter (Signed)
Returned pt's call and gave him Dr. Clovis Riley advice.  Pt verbalizes understanding.

## 2020-06-06 ENCOUNTER — Encounter: Payer: Self-pay | Admitting: Internal Medicine

## 2020-06-06 ENCOUNTER — Other Ambulatory Visit: Payer: Self-pay

## 2020-06-06 ENCOUNTER — Ambulatory Visit (INDEPENDENT_AMBULATORY_CARE_PROVIDER_SITE_OTHER): Payer: BC Managed Care – PPO | Admitting: Internal Medicine

## 2020-06-06 VITALS — BP 120/70 | HR 88 | Temp 98.5°F | Resp 18 | Ht 68.0 in | Wt 197.4 lb

## 2020-06-06 DIAGNOSIS — R7401 Elevation of levels of liver transaminase levels: Secondary | ICD-10-CM | POA: Diagnosis not present

## 2020-06-06 DIAGNOSIS — Z0000 Encounter for general adult medical examination without abnormal findings: Secondary | ICD-10-CM

## 2020-06-06 LAB — COMPREHENSIVE METABOLIC PANEL
ALT: 117 U/L — ABNORMAL HIGH (ref 0–53)
AST: 41 U/L — ABNORMAL HIGH (ref 0–37)
Albumin: 5.1 g/dL (ref 3.5–5.2)
Alkaline Phosphatase: 108 U/L (ref 39–117)
BUN: 14 mg/dL (ref 6–23)
CO2: 28 mEq/L (ref 19–32)
Calcium: 9.6 mg/dL (ref 8.4–10.5)
Chloride: 101 mEq/L (ref 96–112)
Creatinine, Ser: 1.1 mg/dL (ref 0.40–1.50)
GFR: 87.34 mL/min (ref >60.00)
Glucose, Bld: 92 mg/dL (ref 70–99)
Potassium: 3.8 mEq/L (ref 3.5–5.1)
Sodium: 138 mEq/L (ref 135–145)
Total Bilirubin: 0.7 mg/dL (ref 0.2–1.2)
Total Protein: 7.9 g/dL (ref 6.0–8.3)

## 2020-06-06 LAB — CBC
HCT: 45.6 % (ref 39.0–52.0)
Hemoglobin: 15.9 g/dL (ref 13.0–17.0)
MCHC: 34.9 g/dL (ref 30.0–36.0)
MCV: 85.4 fl (ref 78.0–100.0)
Platelets: 216 10*3/uL (ref 150.0–400.0)
RBC: 5.34 Mil/uL (ref 4.22–5.81)
RDW: 13 % (ref 11.5–15.5)
WBC: 5.2 10*3/uL (ref 4.0–10.5)

## 2020-06-06 LAB — LIPID PANEL
Cholesterol: 168 mg/dL (ref 0–200)
HDL: 45.8 mg/dL (ref >39.00)
LDL Cholesterol: 91 mg/dL (ref 0–99)
NonHDL: 121.77
Total CHOL/HDL Ratio: 4
Triglycerides: 154 mg/dL — ABNORMAL HIGH (ref 0.0–149.0)
VLDL: 30.8 mg/dL (ref 0.0–40.0)

## 2020-06-06 LAB — HEMOGLOBIN A1C: Hgb A1c MFr Bld: 5.4 % (ref 4.6–6.5)

## 2020-06-06 NOTE — Progress Notes (Signed)
   Subjective:   Patient ID: Dustin Warren, male    DOB: May 16, 1985, 35 y.o.   MRN: [id number]  HPI The patient is a 35 YO man coming in for physical.   PMH, [place], social history reviewed and updated.  Review of Systems  Constitutional: Negative.   HENT: Negative.   Eyes: Negative.   Respiratory: Negative for cough, chest tightness and shortness of breath.   Cardiovascular: Negative for chest pain, palpitations and leg swelling.  Gastrointestinal: Negative for abdominal distention, abdominal pain, constipation, diarrhea, nausea and vomiting.  Musculoskeletal: Negative.   Skin: Negative.   Neurological: Negative.   Psychiatric/Behavioral: Negative.     Objective:  Physical Exam Constitutional:      Appearance: He is well-developed.  HENT:     Head: Normocephalic and atraumatic.  Cardiovascular:     Rate and Rhythm: Normal rate and regular rhythm.  Pulmonary:     Effort: Pulmonary effort is normal. No respiratory distress.     Breath sounds: Normal breath sounds. No wheezing or rales.  Abdominal:     General: Bowel sounds are normal. There is no distension.     Palpations: Abdomen is soft.     Tenderness: There is no abdominal tenderness. There is no rebound.  Musculoskeletal:     Cervical back: Normal range of motion.  Skin:    General: Skin is warm and dry.  Neurological:     Mental Status: He is alert and oriented to person, place, and time.     Coordination: Coordination normal.     Vitals:   06/06/20 0805  BP: 120/70  Pulse: 88  Resp: 18  Temp: 98.5 F (36.9 C)  TempSrc: Oral  SpO2: 99%  Weight: 197 lb 6.4 oz (89.5 kg)  Height: 5\' 8"  (1.727 m)    This visit occurred during the SARS-CoV-2 public health emergency.  Safety protocols were in place, including screening questions prior to the visit, additional usage of staff PPE, and extensive cleaning of exam room while observing appropriate contact time as indicated for disinfecting solutions.   Assessment &  Plan:

## 2020-06-06 NOTE — Assessment & Plan Note (Signed)
Recheck LFTs. Is not drinking alcohol currently due to dieting.

## 2020-06-06 NOTE — Assessment & Plan Note (Signed)
Flu shot declines. Covid-19 2 shots encouraged booster. Tetanus up to date. Colonoscopy up to date. Counseled about sun safety and mole surveillance. Counseled about the dangers of distracted driving. Given 10 year screening recommendations.

## 2020-06-06 NOTE — Patient Instructions (Addendum)
Go to vaccines.gov to find the booster covid shot near you.  Health Maintenance, Male Adopting a healthy lifestyle and getting preventive care are important in promoting health and wellness. Ask your health care provider about:  The right schedule for you to have regular tests and exams.  Things you can do on your own to prevent diseases and keep yourself healthy. What should I know about diet, weight, and exercise? Eat a healthy diet  Eat a diet that includes plenty of vegetables, fruits, low-fat dairy products, and lean protein.  Do not eat a lot of foods that are high in solid fats, added sugars, or sodium.   Maintain a healthy weight Body mass index (BMI) is a measurement that can be used to identify possible weight problems. It estimates body fat based on height and weight. Your health care provider can help determine your BMI and help you achieve or maintain a healthy weight. Get regular exercise Get regular exercise. This is one of the most important things you can do for your health. Most adults should:  Exercise for at least 150 minutes each week. The exercise should increase your heart rate and make you sweat (moderate-intensity exercise).  Do strengthening exercises at least twice a week. This is in addition to the moderate-intensity exercise.  Spend less time sitting. Even light physical activity can be beneficial. Watch cholesterol and blood lipids Have your blood tested for lipids and cholesterol at 35 years of age, then have this test every 5 years. You may need to have your cholesterol levels checked more often if:  Your lipid or cholesterol levels are high.  You are older than 36 years of age.  You are at high risk for heart disease. What should I know about cancer screening? Many types of cancers can be detected early and may often be prevented. Depending on your health history and family history, you may need to have cancer screening at various ages. This may  include screening for:  Colorectal cancer.  Prostate cancer.  Skin cancer.  Lung cancer. What should I know about heart disease, diabetes, and high blood pressure? Blood pressure and heart disease  High blood pressure causes heart disease and increases the risk of stroke. This is more likely to develop in people who have high blood pressure readings, are of African descent, or are overweight.  Talk with your health care provider about your target blood pressure readings.  Have your blood pressure checked: ? Every 3-5 years if you are 25-85 years of age. ? Every year if you are 65 years old or older.  If you are between the ages of 62 and 54 and are a current or former smoker, ask your health care provider if you should have a one-time screening for abdominal aortic aneurysm (AAA). Diabetes Have regular diabetes screenings. This checks your fasting blood sugar level. Have the screening done:  Once every three years after age 49 if you are at a normal weight and have a low risk for diabetes.  More often and at a younger age if you are overweight or have a high risk for diabetes. What should I know about preventing infection? Hepatitis B If you have a higher risk for hepatitis B, you should be screened for this virus. Talk with your health care provider to find out if you are at risk for hepatitis B infection. Hepatitis C Blood testing is recommended for:  Everyone born from [birth year] through 1965.  Anyone with known risk factors for  hepatitis C. Sexually transmitted infections (STIs)  You should be screened each year for STIs, including gonorrhea and chlamydia, if: ? You are sexually active and are younger than 35 years of age. ? You are older than 35 years of age and your health care provider tells you that you are at risk for this type of infection. ? Your sexual activity has changed since you were last screened, and you are at increased risk for chlamydia or gonorrhea. Ask your  health care provider if you are at risk.  Ask your health care provider about whether you are at high risk for HIV. Your health care provider may recommend a prescription medicine to help prevent HIV infection. If you choose to take medicine to prevent HIV, you should first get tested for HIV. You should then be tested every 3 months for as long as you are taking the medicine. Follow these instructions at home: Lifestyle  Do not use any products that contain nicotine or tobacco, such as cigarettes, e-cigarettes, and chewing tobacco. If you need help quitting, ask your health care provider.  Do not use street drugs.  Do not share needles.  Ask your health care provider for help if you need support or information about quitting drugs. Alcohol use  Do not drink alcohol if your health care provider tells you not to drink.  If you drink alcohol: ? Limit how much you have to 0-2 drinks a day. ? Be aware of how much alcohol is in your drink. In the [place] one drink equals one 12 oz bottle of beer (355 mL), one 5 oz glass of wine (148 mL), or one 1 oz glass of hard liquor (44 mL). General instructions  Schedule regular health, dental, and eye exams.  Stay current with your vaccines.  Tell your health care provider if: ? You often feel depressed. ? You have ever been abused or do not feel safe at home. Summary  Adopting a healthy lifestyle and getting preventive care are important in promoting health and wellness.  Follow your health care provider's instructions about healthy diet, exercising, and getting tested or screened for diseases.  Follow your health care provider's instructions on monitoring your cholesterol and blood pressure. This information is not intended to replace advice given to you by your health care provider. Make sure you discuss any questions you have with your health care provider. Document Revised: 02/25/2018 Document Reviewed: 02/25/2018 Elsevier Patient Education   2021 [employer].

## 2020-06-08 ENCOUNTER — Other Ambulatory Visit: Payer: Self-pay | Admitting: Internal Medicine

## 2020-06-08 DIAGNOSIS — R7401 Elevation of levels of liver transaminase levels: Secondary | ICD-10-CM

## 2020-06-19 ENCOUNTER — Telehealth: Payer: Self-pay | Admitting: Internal Medicine

## 2020-06-19 NOTE — Telephone Encounter (Signed)
1.Medication Requested: omeprazole (PRILOSEC) 40 MG capsule    2. Pharmacy (Name, Street, Casa): [id number] - [place], [place]  3. On Med List: yes   4. Last Visit with PCP: 06-06-20  5. Next visit date with PCP: n/a   Agent: Please be advised that RX refills may take up to 3 business days. We ask that you follow-up with your pharmacy.

## 2020-06-19 NOTE — Telephone Encounter (Signed)
Ok to refill? Please advise.  

## 2020-06-21 ENCOUNTER — Encounter: Payer: Self-pay | Admitting: Internal Medicine

## 2020-06-22 MED ORDER — OMEPRAZOLE 40 MG PO CPDR: 40.0000 mg | DELAYED_RELEASE_CAPSULE | Freq: Every day | ORAL | 3 refills | Status: DC

## 2020-07-06 ENCOUNTER — Telehealth: Payer: Self-pay | Admitting: Internal Medicine

## 2020-07-06 NOTE — Telephone Encounter (Signed)
Spoke with the patient and he has scheduled a lab appointment for 07/17/2020 at 8:15 am. No other questions or concerns at this time.

## 2020-07-06 NOTE — Telephone Encounter (Signed)
    Patient would like to know when he should have labs again. Last visit he had elevated ALT.   When should he have repeat labs and/or follow up   Please call patient

## 2020-07-17 ENCOUNTER — Other Ambulatory Visit (INDEPENDENT_AMBULATORY_CARE_PROVIDER_SITE_OTHER): Payer: BC Managed Care – PPO

## 2020-07-17 ENCOUNTER — Other Ambulatory Visit: Payer: Self-pay | Admitting: Internal Medicine

## 2020-07-17 ENCOUNTER — Other Ambulatory Visit: Payer: Self-pay

## 2020-07-17 DIAGNOSIS — R7401 Elevation of levels of liver transaminase levels: Secondary | ICD-10-CM

## 2020-07-17 LAB — COMPREHENSIVE METABOLIC PANEL
ALT: 172 U/L — ABNORMAL HIGH (ref 0–53)
AST: 96 U/L — ABNORMAL HIGH (ref 0–37)
Albumin: 4.7 g/dL (ref 3.5–5.2)
Alkaline Phosphatase: 106 U/L (ref 39–117)
BUN: 13 mg/dL (ref 6–23)
CO2: 29 mEq/L (ref 19–32)
Calcium: 9.4 mg/dL (ref 8.4–10.5)
Chloride: 103 mEq/L (ref 96–112)
Creatinine, Ser: 1 mg/dL (ref 0.40–1.50)
GFR: 97.85 mL/min (ref >60.00)
Glucose, Bld: 99 mg/dL (ref 70–99)
Potassium: 4 mEq/L (ref 3.5–5.1)
Sodium: 140 mEq/L (ref 135–145)
Total Bilirubin: 0.5 mg/dL (ref 0.2–1.2)
Total Protein: 7.6 g/dL (ref 6.0–8.3)

## 2020-07-25 ENCOUNTER — Ambulatory Visit
Admission: RE | Admit: 2020-07-25 | Discharge: 2020-07-25 | Disposition: A | Discharge status: Home / Self Care | Payer: BC Managed Care – PPO | Source: Ambulatory Visit | Attending: Internal Medicine | Admitting: Internal Medicine

## 2020-07-25 DIAGNOSIS — R7989 Other specified abnormal findings of blood chemistry: Secondary | ICD-10-CM | POA: Diagnosis not present

## 2020-07-25 DIAGNOSIS — R7401 Elevation of levels of liver transaminase levels: Secondary | ICD-10-CM

## 2020-08-23 ENCOUNTER — Encounter: Payer: Self-pay | Admitting: Internal Medicine

## 2020-08-23 DIAGNOSIS — R7401 Elevation of levels of liver transaminase levels: Secondary | ICD-10-CM

## 2020-10-04 ENCOUNTER — Encounter: Payer: Self-pay | Admitting: Nurse Practitioner

## 2020-10-04 ENCOUNTER — Ambulatory Visit (INDEPENDENT_AMBULATORY_CARE_PROVIDER_SITE_OTHER): Payer: BC Managed Care – PPO | Admitting: Nurse Practitioner

## 2020-10-04 ENCOUNTER — Other Ambulatory Visit (INDEPENDENT_AMBULATORY_CARE_PROVIDER_SITE_OTHER): Payer: BC Managed Care – PPO

## 2020-10-04 VITALS — BP 120/82 | HR 70 | Ht 68.0 in | Wt 195.0 lb

## 2020-10-04 DIAGNOSIS — R7989 Other specified abnormal findings of blood chemistry: Secondary | ICD-10-CM

## 2020-10-04 DIAGNOSIS — K219 Gastro-esophageal reflux disease without esophagitis: Secondary | ICD-10-CM

## 2020-10-04 LAB — HEPATIC FUNCTION PANEL
ALT: 63 U/L — ABNORMAL HIGH (ref 0–53)
AST: 27 U/L (ref 0–37)
Albumin: 4.7 g/dL (ref 3.5–5.2)
Alkaline Phosphatase: 94 U/L (ref 39–117)
Bilirubin, Direct: 0.1 mg/dL (ref 0.0–0.3)
Total Bilirubin: 0.5 mg/dL (ref 0.2–1.2)
Total Protein: 7.4 g/dL (ref 6.0–8.3)

## 2020-10-04 MED ORDER — OMEPRAZOLE 40 MG PO CPDR: 40.0000 mg | DELAYED_RELEASE_CAPSULE | Freq: Every day | ORAL | 3 refills | Status: DC

## 2020-10-04 NOTE — Patient Instructions (Addendum)
If you are age 35 or younger, your body mass index should be between 19-25. Your Body mass index is 29.65 kg/m. If this is out of the aformentioned range listed, please consider follow up with your Primary Care Provider.   The [place] [facility] providers would like to encourage you to use [facility] to communicate with providers for non-urgent requests or questions.  Due to long hold times on the telephone, sending your provider a message by [facility] may be faster and more efficient way to get a response. Please allow 48 business hours for a response.  Please remember that this is for non-urgent requests/questions.  LABS:  Lab work has been ordered for you today. Our lab is located in the basement. Press "B" on the elevator. The lab is located at the first door on the left as you exit the elevator.  HEALTHCARE LAWS AND MY CHART RESULTS: Due to recent changes in healthcare laws, you may see the results of your imaging and laboratory studies on MyChart before your provider has had a chance to review them.   We understand that in some cases there may be results that are confusing or concerning to you. Not all laboratory results come back in the same time frame and the provider may be waiting for multiple results in order to interpret others.  Please give [place] 48 hours in order for your provider to thoroughly review all the results before contacting the office for clarification of your results.   Continue Prilosec.  It was great seeing you today! Thank you for entrusting me with your care and choosing [facility]  Tye Savoy, NP

## 2020-10-04 NOTE — Progress Notes (Addendum)
ASSESSMENT AND PLAN    # 35 yo male with chronically elevated liver enzymes typically 2x ULN or less but with recent further elevation to about 3x ULN ( AST 96 / ALT 172).  Complete hep hepatic serologic work-up in 2018 was unrevealing ( celiac studies not done but no evidence for celiac disease on EGD).  Abdominal ultrasound in 2018 was normal as was a RUQ ultrasound in May of this year.  -- Omeprazole has rarely been associated with drug induced liver injury and he doesn't take any other medications. Labs in 2018 don't suggest autoimmune liver disease. Etoh intake is low and pattern of liver enzyme elevation not consistent with alcohol .  No evidence for fatty liver or other processes on two ultrasounds.  -- Last hepatic function panel was early May, will repeat today to see where things are -- Previously vaccinated for hepatitis A and hepatitis B --Will discuss case with Dr. Ardis Hughs. A liver biopsy might be the next step  Addendum: Discussed with Dr. Ardis Hughs who recommends referral to [facility].   # History of IDA in 2019 which was likely secondary to ulcerative esophagitis.    # GERD / hx of ulcerative esophagitis leading to IDA in 2019.  He recently stopped PPI out of concern for elevated LFTs but got recurrent heartburn within two weeks so back on Omeprazole --We discussed anti-reflux measures --Continue daily Omeprazole  --Recent hgb 15.9   # History of adenomatous colon polyps in 2019.  He is on the list for a 5-year recall colonoscopy in 2024   HISTORY OF PRESENT ILLNESS     Chief Complaint : elevated liver tests  Dustin Warren is a 35 y.o. male with past medical history of GERD / ulcerated esophagitis, iron deficiency anemia and adenomatous colon polyps  See PMH below for any additional medical history.   Patient is referred by PCP for evaluation of elevated LFTs. He was evaluated by Dr. Ardis Hughs in 2019 for elevated LFTs and iron deficiency anemia. EGD remarkable  for ulcerative esophagitis which was felt to be the source of IDA.  Colonoscopy remarkable for 2 small adenomatous colon polyps .  Complete hepatic serologic work-up for evaluation of elevated liver enzymes was basically on revealing.   Patient's liver enzymes have been elevated for at least 4 years.  ALT was generally been 2 x ULN or less. Elevations in AST have been less pronounced. At time of annual exam in March 2022 patient's ALT had risen to 117. The only thing he was doing different was supplementing evening meal with herbal Life shakes which helped him lose ~ 30 pounds.  Following annual exam patient stopped Herbal Life shakes ( regained most of the weight) but follow labs in early May showed a further increase in ALT to 172, AST doubled to 96.  Other than the period of time he was using herbal life shakes patient does not take vitamins or supplements.  He has only negligible use of Tylenol. He averages no more than 2 Etoh beverages a month. No known [place] of liver disease.  No chemical/toxins exposure at work that he knows of  Out of concern for elevated liver enzymes patient stopped omeprazole.  He developed recurrent GERD symptoms within 2 weeks so he resumed treatment and doing well   Data Reviewed: Prior workup for elevated liver tests   Component     Latest Ref Rng & Units 12/27/2016  Iron     42 - 165 ug/dL  84  Transferrin     212.0 - 360.0 mg/dL 280.0  Saturation Ratios     20.0 - 50.0 % 21.4  Ferritin     22.0 - 322.0 ng/mL 19.4 (L)  Anti Nuclear Antibody (ANA)     NEGATIVE NEGATIVE  Mitochondrial M2 Ab, IgG     U < OR = 20.0  Actin (Smooth Muscle) Antibody (IGG)     <20 U <20  A-1 Antitrypsin, Ser     83 - 199 mg/dL 152  Ceruloplasmin     18 - 36 mg/dL 23  IgA     68 - 378 mg/dL 202  (tTG) Ab, IgA     U/mL 1    2018  -negative for HCVab. He has been vaccinated against HAV and HBV and both antibodies were reactive.    2018 Abd [place] was unremarkable  May 2022 RUQ  was unremarkable.    PREVIOUS EVALUATIONS:   January 2019 EGD Severe, ulcerative, friable acid related esophagitis, biopsied to exclude neoplasm. This is almost certainly the cause of your iron deficiency anemia. - Small hiatal hernia without Cameron's erosions. - The examination was otherwise normal.  January 2019 colonoscopy for IDA Two 3 to 6 mm polyps in the descending colon and in the transverse colon, removed with a cold snare. Resected and retrieved. - The examination was otherwise normal on direct and retroflexion views (including normal terminal ileum).  March 2019 EGD Focal, very mild peptic stricture above a small hiatal hernia. - The previously noted severe ulcerative esophagitis has completely healed.   Past Medical History:  Diagnosis Date   Allergy    Anemia    Asthma    as a child   GERD (gastroesophageal reflux disease)    Neuromuscular disorder ([place])    muscle pain after tick bite      Past Surgical History:  Procedure Laterality Date   COLONOSCOPY     MYRINGOTOMY     POLYPECTOMY     TYMPANOSTOMY TUBE PLACEMENT     UPPER GASTROINTESTINAL ENDOSCOPY     WISDOM TOOTH EXTRACTION  03/2017   Family History  Problem Relation Age of Onset   Arthritis Mother        OA   Hyperlipidemia Mother    Hypertension Mother    Diabetes Mother    Breast cancer Maternal Grandmother        breast   Heart disease Maternal Grandfather    Arthritis Maternal Grandfather        rheumatoid   Colon cancer Maternal Grandfather    Stroke Paternal Grandfather    Arthritis Maternal Aunt    Arthritis Maternal Uncle    Cancer Paternal Grandmother 79       colon, oral   Esophageal cancer Paternal Grandmother    Stomach cancer Neg Hx    Pancreatic cancer Neg Hx    Rectal cancer Neg Hx    Colon polyps Neg Hx    Social History   Tobacco Use   Smoking status: Former    Types: Cigarettes   Smokeless tobacco: Never   Tobacco comments:    1-2 cigarettes per day  Vaping  Use   Vaping Use: Never used  Substance Use Topics   Alcohol use: Yes    Alcohol/week: 0.0 standard drinks    Comment: Occasional   Drug use: No   Current Outpatient Medications  Medication Sig Dispense Refill   omeprazole (PRILOSEC) 40 MG capsule Take 1 capsule (40 mg total) by mouth  daily. 90 capsule 3   No current facility-administered medications for this visit.   No Known Allergies   Review of Systems: All systems reviewed and negative except where noted in HPI.    PHYSICAL EXAM :    Wt Readings from Last 3 Encounters:  10/04/20 195 lb (88.5 kg)  06/06/20 197 lb 6.4 oz (89.5 kg)  09/23/19 209 lb (94.8 kg)    BP 120/82   Pulse 70   Ht 5\' 8"  (1.727 m)   Wt 195 lb (88.5 kg)   BMI 29.65 kg/m  Constitutional:  Pleasant male in no acute distress. Psychiatric: Normal mood and affect. Behavior is normal. EENT: Pupils normal.  Conjunctivae are normal. No scleral icterus. Neck supple.  Cardiovascular: Normal rate, regular rhythm. No edema Pulmonary/chest: Effort normal and breath sounds normal. No wheezing, rales or rhonchi. Abdominal: Soft, nondistended, nontender. Bowel sounds active throughout. There are no masses palpable. No hepatomegaly. Neurological: Alert and oriented to person place and time. Skin: Skin is warm and dry. No rashes noted.  Tye Savoy, NP  10/04/2020, 3:10 PM  Cc:  Referring Provider Hoyt Koch, *

## 2020-10-05 NOTE — Progress Notes (Signed)
I agree with the above note, plan 

## 2020-10-06 ENCOUNTER — Telehealth: Payer: Self-pay

## 2020-10-06 NOTE — Telephone Encounter (Signed)
Patient contacted and advised of this plan. He agrees to this plan. He also asks that his provider know he remember that 3 years ago he had a "bad tick bite" and had to take antibiotics. He states this was at the time he was noted to have elevated LFT. Records and referral form faxed to [place].

## 2020-10-06 NOTE — Telephone Encounter (Signed)
-----   Message from Willia Craze, NP sent at 10/06/2020  8:36 AM EDT ----- Good morning Beth, will you call [facility] and tell him that we would like to refer him to [facility] for persistently elevated LFTs of unknown cause.  They may want a liver biopsy but we will leave that to them.  Can you put in the referral please?  Thanks

## 2020-10-19 NOTE — Telephone Encounter (Signed)
Inbound call from patient stating referring provider canceled his appt that he had scheduled with them and is wanting to know if he really needs to be seen.  Please advise.

## 2020-10-19 NOTE — Telephone Encounter (Signed)
Dustin Warren The patient wants to know if this appointment is really necessary. Can he repeat the labs and see if the ALT continues to improve. He has not been seen by [facility] yet. His appointment is tomorrow however they are rescheduling him due to provider illness.  He really wants to be sure he has to go before he reschedules. Your thoughts?

## 2020-10-20 NOTE — Telephone Encounter (Signed)
Patient said he will go forward with the referral. He will contact the [facility].

## 2020-11-15 IMAGING — MR MR CERVICAL SPINE W/O CM
5 series · 41 of 48 positions shown · non-contrast
Comparison: None.

CLINICAL DATA: Cervical radiculopathy. Left arm and shoulder
weakness with numbness for 8 weeks

EXAM:
MRI CERVICAL SPINE WITHOUT CONTRAST
TECHNIQUE: Multiplanar, multisequence MR imaging of the cervical spine was
performed. No intravenous contrast was administered.

[Series 2: T2 · sagittal · 3.0mm · 0.69mm/px · 6 of 13 slices shown (1 of 2)]
[im 1/13]
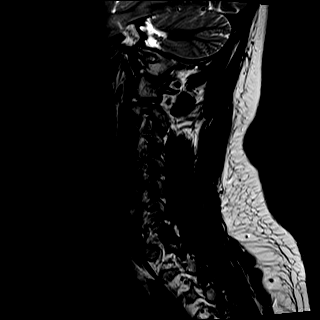
[im 3/13]
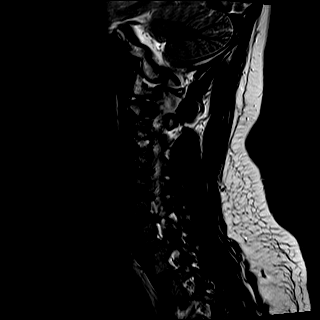
[im 5/13]
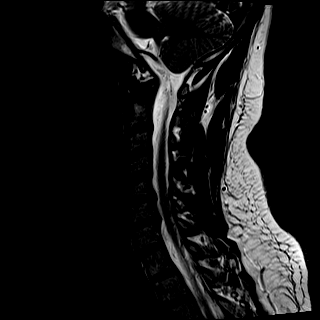
[im 8/13]
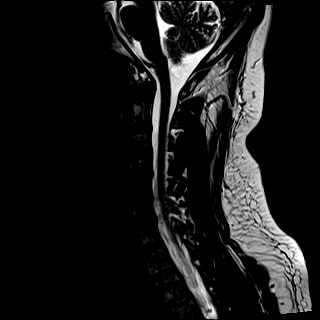
[im 10/13]
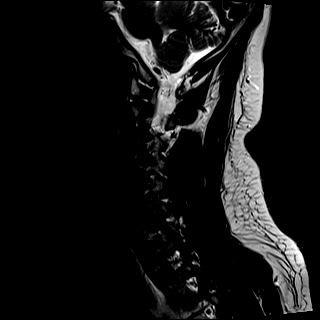
[im 13/13]
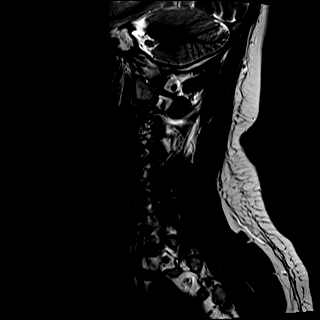

[Series 3: T1 · sagittal · 3.0mm · 0.86mm/px · 7 of 13 slices shown]
[im 1/13]
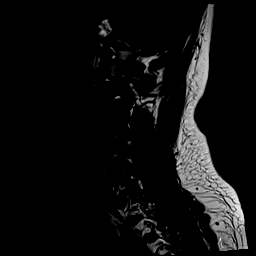
[im 3/13]
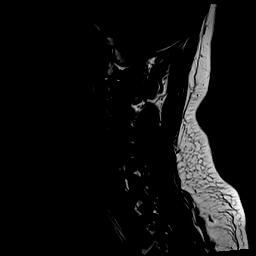
[im 5/13]
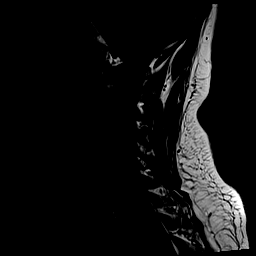
[im 7/13]
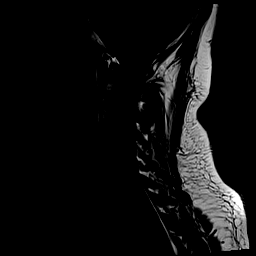
[im 9/13]
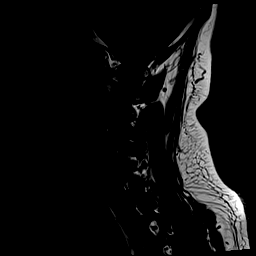
[im 11/13]
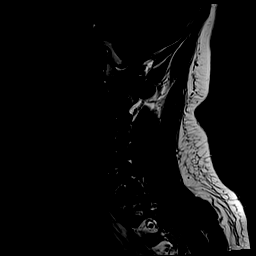
[im 13/13]
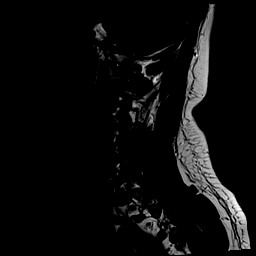

[Series 4: STIR · sagittal · 3.0mm · 0.69mm/px · 7 of 13 slices shown]
[im 1/13]
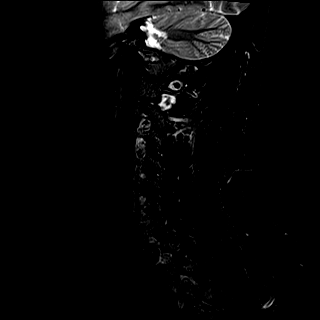
[im 3/13]
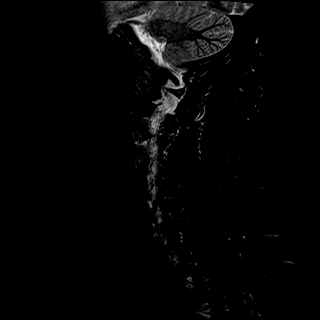
[im 5/13]
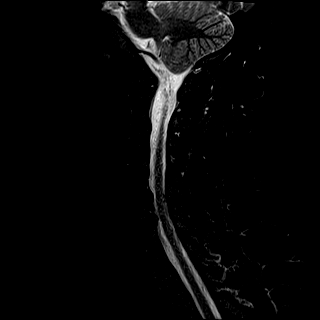
[im 7/13]
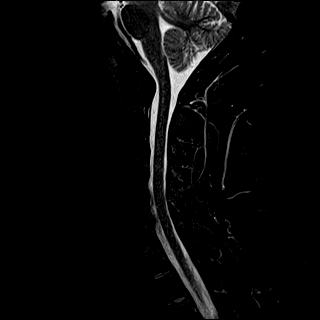
[im 9/13]
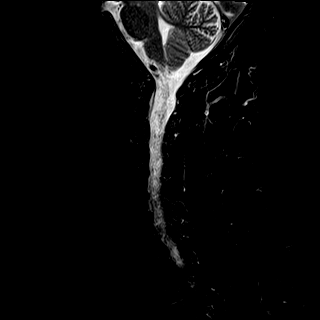
[im 11/13]
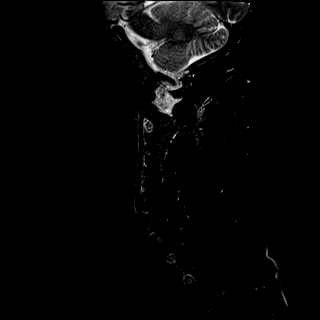
[im 13/13]
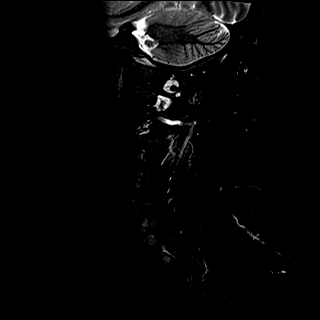

[Series 5: T2 · axial · 3.0mm · 0.62mm/px · z∈[-37,+63]mm · 13 of 27 slices shown (2 of 2)]
[im 1/27]
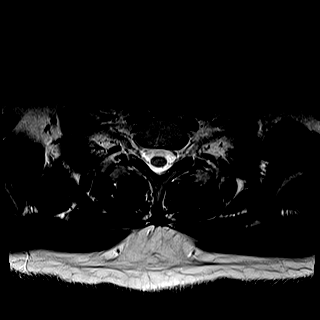
[im 3/27]
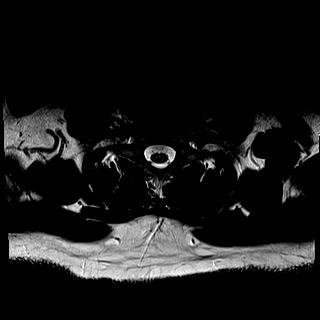
[im 5/27]
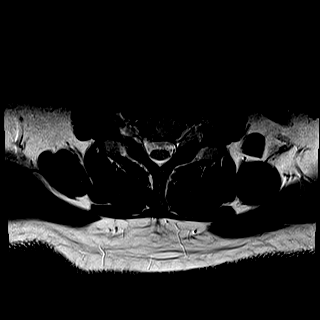
[im 7/27]
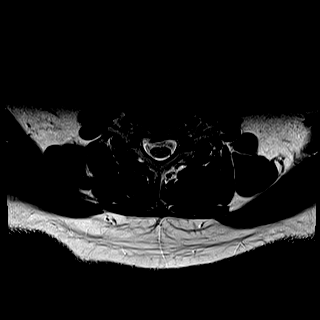
[im 9/27]
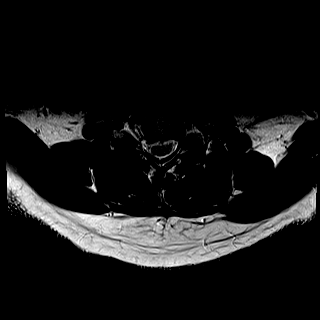
[im 11/27]
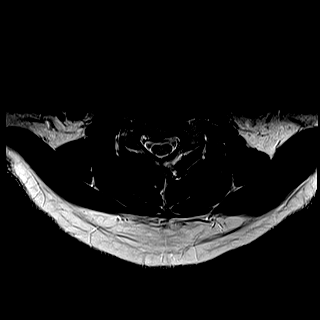
[im 13/27]
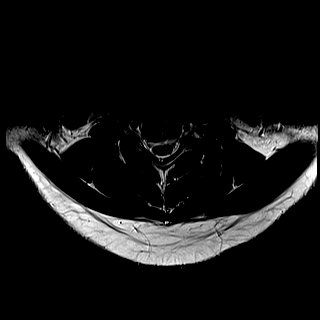
[im 15/27]
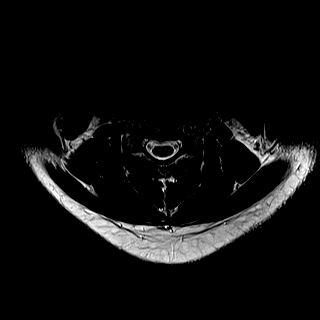
[im 17/27]
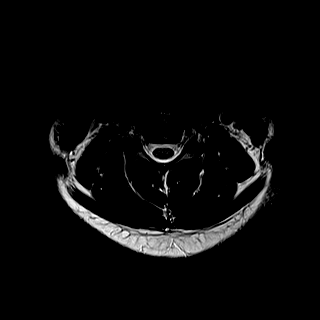
[im 19/27]
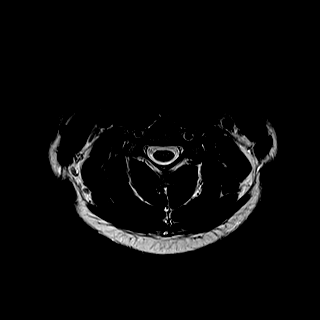
[im 21/27]
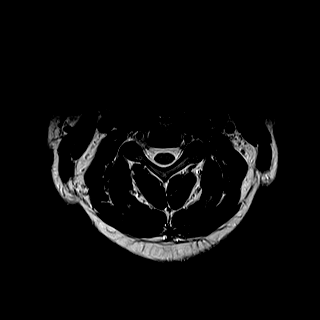
[im 23/27]
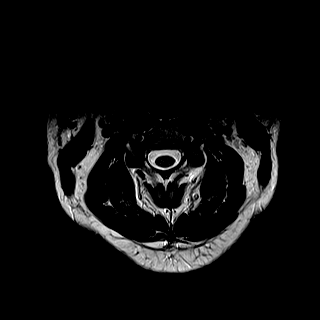
[im 27/27]
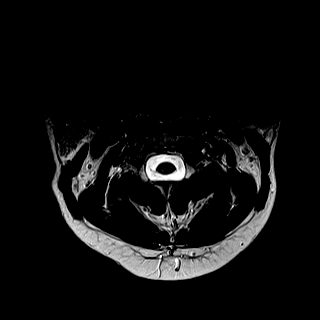

[Series 6: mpgr ax · axial · 3.0mm · 0.35mm/px · z∈[-32,+68]mm · 8 of 27 slices shown]
[im 1/27]
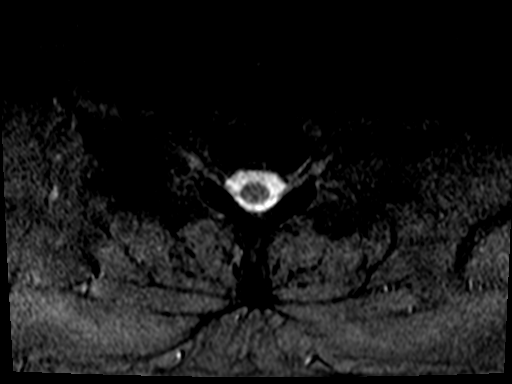
[im 5/27]
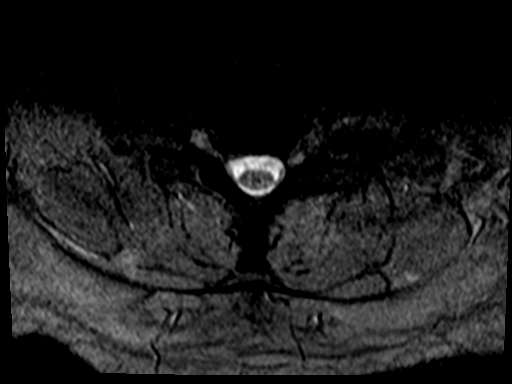
[im 9/27]
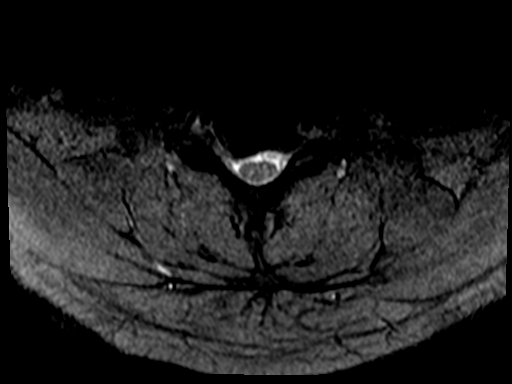
[im 13/27]
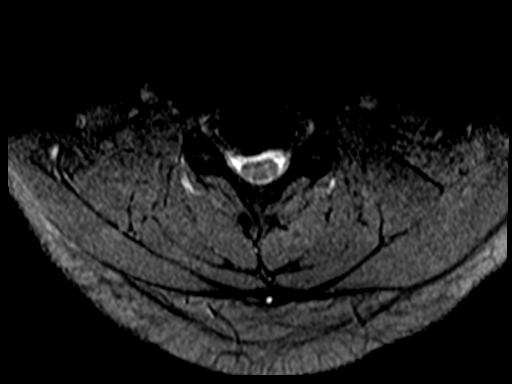
[im 15/27]
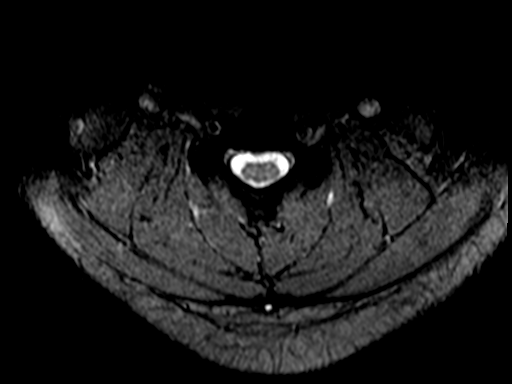
[im 19/27]
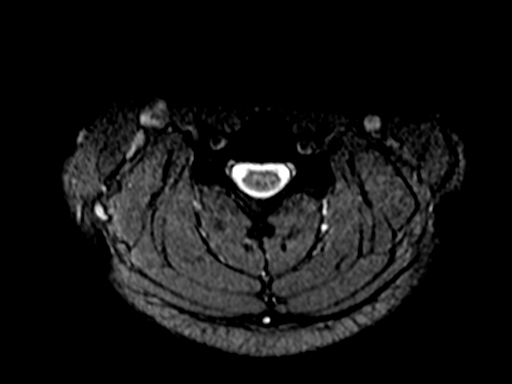
[im 23/27]
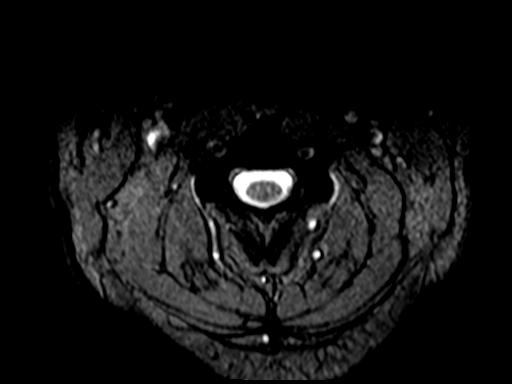
[im 27/27]
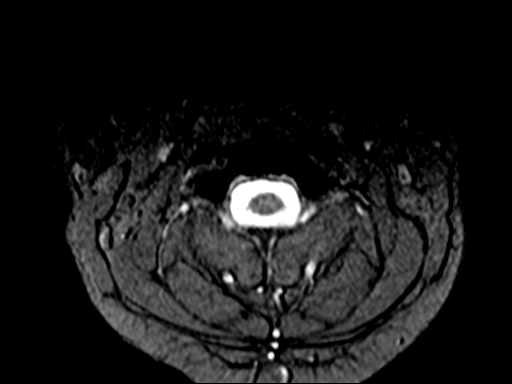

[41 of 48 positions shown; findings below may reference images not displayed]

FINDINGS: Alignment: Physiologic.

Vertebrae: No fracture, evidence of discitis, or bone lesion.

Cord: Normal signal and morphology.

Posterior Fossa, vertebral arteries, paraspinal tissues: Posterior
fossa demonstrates no focal abnormality. Vertebral artery flow voids
are maintained. Paraspinal soft tissues are unremarkable.

Disc levels:

Discs: Degenerative disease with disc height loss at C6-7.

C2-3: No significant disc bulge. No neural foraminal stenosis. No
central canal stenosis.

C3-4: No significant disc bulge. No neural foraminal stenosis. No
central canal stenosis.

C4-5: No significant disc bulge. No neural foraminal stenosis. No
central canal stenosis.

C5-6: Right paracentral broad disc protrusion. No neural foraminal
stenosis. No central canal stenosis.

C6-7: Broad right paracentral disc protrusion. Moderate left
foraminal stenosis. No right foraminal stenosis. No central canal
stenosis.

C7-T1: No significant disc bulge. No neural foraminal stenosis. No
central canal stenosis.
IMPRESSION: 1. At C6-7 there is a broad right paracentral disc protrusion.
Moderate left foraminal stenosis.
2. At C5-6 there is a right paracentral broad disc protrusion.

## 2020-11-27 DIAGNOSIS — K219 Gastro-esophageal reflux disease without esophagitis: Secondary | ICD-10-CM | POA: Insufficient documentation

## 2020-11-27 DIAGNOSIS — R748 Abnormal levels of other serum enzymes: Secondary | ICD-10-CM | POA: Diagnosis not present

## 2021-03-06 DIAGNOSIS — K760 Fatty (change of) liver, not elsewhere classified: Secondary | ICD-10-CM | POA: Insufficient documentation

## 2021-03-07 DIAGNOSIS — K760 Fatty (change of) liver, not elsewhere classified: Secondary | ICD-10-CM | POA: Diagnosis not present

## 2021-03-07 DIAGNOSIS — R748 Abnormal levels of other serum enzymes: Secondary | ICD-10-CM | POA: Diagnosis not present

## 2021-07-25 DIAGNOSIS — L441 Lichen nitidus: Secondary | ICD-10-CM | POA: Diagnosis not present

## 2021-07-25 DIAGNOSIS — D485 Neoplasm of uncertain behavior of skin: Secondary | ICD-10-CM | POA: Diagnosis not present

## 2021-08-10 ENCOUNTER — Ambulatory Visit: Payer: BC Managed Care – PPO | Admitting: Family Medicine

## 2021-08-10 ENCOUNTER — Encounter: Payer: Self-pay | Admitting: Family Medicine

## 2021-08-10 VITALS — BP 126/78 | HR 80 | Temp 97.5°F | Ht 68.0 in | Wt 183.0 lb

## 2021-08-10 DIAGNOSIS — Z20818 Contact with and (suspected) exposure to other bacterial communicable diseases: Secondary | ICD-10-CM | POA: Diagnosis not present

## 2021-08-10 DIAGNOSIS — J301 Allergic rhinitis due to pollen: Secondary | ICD-10-CM | POA: Diagnosis not present

## 2021-08-10 DIAGNOSIS — J029 Acute pharyngitis, unspecified: Secondary | ICD-10-CM | POA: Insufficient documentation

## 2021-08-10 MED ORDER — AMOXICILLIN 875 MG PO TABS: 875.0000 mg | ORAL_TABLET | Freq: Two times a day (BID) | ORAL | 0 refills | Status: AC

## 2021-08-10 NOTE — Patient Instructions (Addendum)
Take the antibiotic as prescribed.   Stay well hydrated.   Use salt water gargles.  Follow up as needed   Pharyngitis  Pharyngitis is inflammation of the throat (pharynx). It is a very common cause of sore throat. Pharyngitis can be caused by a bacteria, but it is usually caused by a virus. Most cases of pharyngitis get better on their own without treatment. What are the causes? This condition may be caused by: Infection by viruses (viral). Viral pharyngitis spreads easily from person to person (is contagious) through coughing, sneezing, and sharing of personal items or utensils such as cups, forks, spoons, and toothbrushes. Infection by bacteria (bacterial). Bacterial pharyngitis may be spread by touching the nose or face after coming in contact with the bacteria, or through close contact, such as kissing. Allergies. Allergies can cause buildup of mucus in the throat (post-nasal drip), leading to inflammation and irritation. Allergies can also cause blocked nasal passages, forcing breathing through the mouth, which dries and irritates the throat. What increases the risk? You are more likely to develop this condition if: You are 75-5 years old. You are exposed to crowded environments such as daycare, school, or dormitory living. You live in a cold climate. You have a weakened disease-fighting (immune) system. What are the signs or symptoms? Symptoms of this condition vary by the cause. Common symptoms of this condition include: Sore throat. Fatigue. Low-grade fever. Stuffy nose (nasal congestion) and cough. Headache. Other symptoms may include: Glands in the neck (lymph nodes) that are swollen. Skin rashes. Plaque-like film on the throat or tonsils. This is often a symptom of bacterial pharyngitis. Vomiting. Red, itchy eyes (conjunctivitis). Loss of appetite. Joint pain and muscle aches. Enlarged tonsils. How is this diagnosed? This condition may be diagnosed based on your  medical history and a physical exam. Your health care provider will ask you questions about your illness and your symptoms. A swab of your throat may be done to check for bacteria (rapid strep test). Other lab tests may also be done, depending on the suspected cause, but these are rare. How is this treated? Many times, treatment is not needed for this condition. Pharyngitis usually gets better in 3-4 days without treatment. Bacterial pharyngitis may be treated with antibiotic medicines. Follow these instructions at home: Medicines Take over-the-counter and prescription medicines only as told by your health care provider. If you were prescribed an antibiotic medicine, take it as told by your health care provider. Do not stop taking the antibiotic even if you start to feel better. Use throat sprays to soothe your throat as told by your health care provider. Children can get pharyngitis. Do not give your child aspirin because of the association with Reye's syndrome. Managing pain To help with pain, try: Sipping warm liquids, such as broth, herbal tea, or warm water. Eating or drinking cold or frozen liquids, such as frozen ice pops. Gargling with a mixture of salt and water 3-4 times a day or as needed. To make salt water, completely dissolve -1 tsp (3-6 g) of salt in 1 cup (237 mL) of warm water. Sucking on hard candy or throat lozenges. Putting a cool-mist humidifier in your bedroom at night to moisten the air. Sitting in the bathroom with the door closed for 5-10 minutes while you run hot water in the shower.  General instructions  Do not use any products that contain nicotine or tobacco. These products include cigarettes, chewing tobacco, and vaping devices, such as e-cigarettes. If you need help  quitting, ask your health care provider. Rest as told by your health care provider. Drink enough fluid to keep your urine pale yellow. How is this prevented? To help prevent becoming infected or  spreading infection: Wash your hands often with soap and water for at least 20 seconds. If soap and water are not available, use hand sanitizer. Do not touch your eyes, nose, or mouth with unwashed hands, and wash hands after touching these areas. Do not share cups or eating utensils. Avoid close contact with people who are sick. Contact a health care provider if: You have large, tender lumps in your neck. You have a rash. You cough up green, yellow-brown, or bloody mucus. Get help right away if: Your neck becomes stiff. You drool or are unable to swallow liquids. You cannot drink or take medicines without vomiting. You have severe pain that does not go away, even after you take medicine. You have trouble breathing, and it is not caused by a stuffy nose. You have new pain and swelling in your joints such as the knees, ankles, wrists, or elbows. These symptoms may represent a serious problem that is an emergency. Do not wait to see if the symptoms will go away. Get medical help right away. Call your local emergency services (911 in the [place] Do not drive yourself to the hospital. Summary Pharyngitis is redness, pain, and swelling (inflammation) of the throat (pharynx). While pharyngitis can be caused by a bacteria, the most common causes are viral. Most cases of pharyngitis get better on their own without treatment. Bacterial pharyngitis is treated with antibiotic medicines. This information is not intended to replace advice given to you by your health care provider. Make sure you discuss any questions you have with your health care provider. Document Revised: 05/31/2020 Document Reviewed: 05/31/2020 Elsevier Patient Education  [place].

## 2021-08-10 NOTE — Progress Notes (Signed)
Subjective:  Dustin Warren is a 36 y.o. male who presents for evaluation of sore throat.  He has had a recent close exposure to someone with proven streptococcal pharyngitis.  His son is being treated for strep throat. Associated symptoms include a one week of his usual allergy symptoms and then he developed sore throat 1 1/2 days ago. Low grade fever, headache, rhinorrhea, mild congestion and occasional dry cough.  No chills, ear pain, dizziness, chest pain, palpitations, shortness of breath, abdominal pain, N/V/D.    No other aggravating or relieving factors.  No other c/o.  The following portions of the patient's history were reviewed and updated as appropriate: allergies, current medications, past medical history, past social history, past surgical history and problem list.  ROS as in subjective  Past Medical History:  Diagnosis Date   Allergy    Anemia    Asthma    as a child   GERD (gastroesophageal reflux disease)    Neuromuscular disorder ([place])    muscle pain after tick bite    Past Surgical History:  Procedure Laterality Date   COLONOSCOPY     MYRINGOTOMY     POLYPECTOMY     TYMPANOSTOMY TUBE PLACEMENT     UPPER GASTROINTESTINAL ENDOSCOPY     WISDOM TOOTH EXTRACTION  03/2017      Objective: Vitals:   08/10/21 0928  BP: 126/78  Pulse: 80  Temp: (!) 97.5 F (36.4 C)  SpO2: 98%    General appearance: no distress, WD/WN, mildly ill-appearing HEENT: normocephalic, conjunctiva/corneas normal, sclerae anicteric, nares patent, no discharge or erythema, pharynx with erythema, tiny white patch on left tonsil, no edema  Oral cavity: MMM, no lesions  Neck: supple, no lymphadenopathy Heart: RRR Lungs: CTA bilaterally, no wheezes, rhonchi, or rales    Laboratory Strep test done. Results: unequivocal.    Assessment and Plan: Acute pharyngitis, unspecified etiology - Plan: amoxicillin (AMOXIL) 875 MG tablet  Strep throat exposure - Plan: amoxicillin (AMOXIL) 875 MG  tablet  Seasonal allergic rhinitis due to pollen  Amoxil prescribed.   Discussed symptomatic treatment including salt water gargles, warm fluids, rest, hydrate well, can use over-the-counter Tylenol for throat pain, fever, or malaise. He may need to start on allergy medications if not resolving.  Follow up if worse or not back to baseline when he completes the antibiotic.

## 2021-08-27 ENCOUNTER — Other Ambulatory Visit: Payer: Self-pay | Admitting: Nurse Practitioner

## 2021-08-27 DIAGNOSIS — K760 Fatty (change of) liver, not elsewhere classified: Secondary | ICD-10-CM | POA: Diagnosis not present

## 2021-08-27 DIAGNOSIS — R748 Abnormal levels of other serum enzymes: Secondary | ICD-10-CM

## 2021-09-04 ENCOUNTER — Ambulatory Visit
Admission: RE | Admit: 2021-09-04 | Discharge: 2021-09-04 | Disposition: A | Discharge status: Home / Self Care | Payer: BC Managed Care – PPO | Source: Ambulatory Visit | Attending: Nurse Practitioner | Admitting: Nurse Practitioner

## 2021-09-04 DIAGNOSIS — R748 Abnormal levels of other serum enzymes: Secondary | ICD-10-CM

## 2021-09-04 DIAGNOSIS — R945 Abnormal results of liver function studies: Secondary | ICD-10-CM | POA: Diagnosis not present

## 2021-10-18 ENCOUNTER — Other Ambulatory Visit: Payer: Self-pay | Admitting: Nurse Practitioner

## 2021-10-19 ENCOUNTER — Telehealth: Payer: Self-pay | Admitting: Internal Medicine

## 2021-10-19 MED ORDER — OMEPRAZOLE 40 MG PO CPDR: 40.0000 mg | DELAYED_RELEASE_CAPSULE | Freq: Every day | ORAL | 3 refills | Status: DC

## 2021-10-19 NOTE — Telephone Encounter (Signed)
Caller & Relationship to patient: Dustin Warren  Call back number: [phone]  Date of last office visit: 06/06/21  Date of next office visit: 11/05/21  Medication(s) to be refilled:  omeprazole (PRILOSEC) 40 MG capsule   Preferred Pharmacy:  [facility] [id number] - [address], [place] Phone:  [phone]  Fax:  [phone]

## 2021-10-19 NOTE — Telephone Encounter (Signed)
Refill has been sent to the pt's pharmacy  

## 2021-11-05 ENCOUNTER — Encounter: Payer: Self-pay | Admitting: Internal Medicine

## 2021-11-05 ENCOUNTER — Ambulatory Visit (INDEPENDENT_AMBULATORY_CARE_PROVIDER_SITE_OTHER): Payer: BC Managed Care – PPO | Admitting: Internal Medicine

## 2021-11-05 VITALS — BP 118/82 | HR 67 | Temp 98.4°F | Ht 68.0 in | Wt 184.0 lb

## 2021-11-05 DIAGNOSIS — Z0000 Encounter for general adult medical examination without abnormal findings: Secondary | ICD-10-CM | POA: Diagnosis not present

## 2021-11-05 DIAGNOSIS — R7401 Elevation of levels of liver transaminase levels: Secondary | ICD-10-CM | POA: Diagnosis not present

## 2021-11-05 DIAGNOSIS — K219 Gastro-esophageal reflux disease without esophagitis: Secondary | ICD-10-CM

## 2021-11-05 DIAGNOSIS — Z23 Encounter for immunization: Secondary | ICD-10-CM

## 2021-11-05 NOTE — Patient Instructions (Signed)
We have given you the flu shot today.  

## 2021-11-05 NOTE — Progress Notes (Unsigned)
   Subjective:   Patient ID: Dustin Warren, male    DOB: 1985/10/27, 36 y.o.   MRN: [id number]  HPI The patient is here for physical.  PMH, [place], social history reviewed and updated  Review of Systems  Constitutional: Negative.   HENT: Negative.    Eyes: Negative.   Respiratory:  Negative for cough, chest tightness and shortness of breath.   Cardiovascular:  Negative for chest pain, palpitations and leg swelling.  Gastrointestinal:  Negative for abdominal distention, abdominal pain, constipation, diarrhea, nausea and vomiting.  Musculoskeletal: Negative.   Skin: Negative.   Neurological: Negative.   Psychiatric/Behavioral: Negative.      Objective:  Physical Exam Constitutional:      Appearance: He is well-developed.  HENT:     Head: Normocephalic and atraumatic.  Cardiovascular:     Rate and Rhythm: Normal rate and regular rhythm.  Pulmonary:     Effort: Pulmonary effort is normal. No respiratory distress.     Breath sounds: Normal breath sounds. No wheezing or rales.  Abdominal:     General: Bowel sounds are normal. There is no distension.     Palpations: Abdomen is soft.     Tenderness: There is no abdominal tenderness. There is no rebound.  Musculoskeletal:     Cervical back: Normal range of motion.  Skin:    General: Skin is warm and dry.  Neurological:     Mental Status: He is alert and oriented to person, place, and time.     Coordination: Coordination normal.     Vitals:   11/05/21 1551  BP: 118/82  Pulse: 67  Temp: 98.4 F (36.9 C)  TempSrc: Oral  SpO2: 97%  Weight: 184 lb (83.5 kg)  Height: '5\' 8"'$  (1.727 m)    Assessment & Plan:  Flu shot given at visit

## 2021-11-06 NOTE — Assessment & Plan Note (Signed)
Controlled with omeprazole 40 mg daily.

## 2021-11-06 NOTE — Assessment & Plan Note (Signed)
Seeing hepatology and getting follow up fibroscan at next visit. ALT has improved most recent 59.

## 2021-11-06 NOTE — Assessment & Plan Note (Signed)
Flu shot given. Covid-19 counseled. Tetanus up to date. Counseled about sun safety and mole surveillance. Counseled about the dangers of distracted driving. Given 10 year screening recommendations.   

## 2022-05-08 ENCOUNTER — Encounter: Payer: Self-pay | Admitting: Gastroenterology

## 2022-06-30 DIAGNOSIS — Z6828 Body mass index (BMI) 28.0-28.9, adult: Secondary | ICD-10-CM | POA: Diagnosis not present

## 2022-06-30 DIAGNOSIS — Z0130 Encounter for examination of blood pressure without abnormal findings: Secondary | ICD-10-CM | POA: Diagnosis not present

## 2022-06-30 DIAGNOSIS — L929 Granulomatous disorder of the skin and subcutaneous tissue, unspecified: Secondary | ICD-10-CM | POA: Diagnosis not present

## 2022-07-17 ENCOUNTER — Encounter: Payer: Self-pay | Admitting: Internal Medicine

## 2022-07-17 ENCOUNTER — Ambulatory Visit: Payer: BC Managed Care – PPO | Admitting: Internal Medicine

## 2022-07-17 VITALS — BP 100/80 | HR 77 | Temp 98.4°F | Ht 68.0 in | Wt 195.0 lb

## 2022-07-17 DIAGNOSIS — K6289 Other specified diseases of anus and rectum: Secondary | ICD-10-CM

## 2022-07-17 MED ORDER — HYDROCORTISONE (PERIANAL) 2.5 % EX CREA: 1.0000 | TOPICAL_CREAM | Freq: Two times a day (BID) | CUTANEOUS | 0 refills | Status: AC

## 2022-07-17 NOTE — Assessment & Plan Note (Signed)
Rx anusol to use BID for 2 weeks and counseled about avoiding constipation and lifting.

## 2022-07-17 NOTE — Progress Notes (Signed)
   Subjective:   Patient ID: Dustin Warren, male    DOB: 04/09/1985, 37 y.o.   MRN: [id number]  HPI The patient is a 37 YO man coming in for recurrence hemorrhoids.   Review of Systems  Constitutional: Negative.   HENT: Negative.    Eyes: Negative.   Respiratory:  Negative for cough, chest tightness and shortness of breath.   Cardiovascular:  Negative for chest pain, palpitations and leg swelling.  Gastrointestinal:  Negative for abdominal distention, abdominal pain, constipation, diarrhea, nausea and vomiting.       Rectal discomfort  Musculoskeletal: Negative.   Skin: Negative.   Neurological: Negative.   Psychiatric/Behavioral: Negative.      Objective:  Physical Exam Constitutional:      Appearance: He is well-developed.  HENT:     Head: Normocephalic and atraumatic.  Cardiovascular:     Rate and Rhythm: Normal rate and regular rhythm.  Pulmonary:     Effort: Pulmonary effort is normal. No respiratory distress.     Breath sounds: Normal breath sounds. No wheezing or rales.  Abdominal:     General: Bowel sounds are normal. There is no distension.     Palpations: Abdomen is soft.     Tenderness: There is no abdominal tenderness. There is no rebound.  Musculoskeletal:     Cervical back: Normal range of motion.  Skin:    General: Skin is warm and dry.  Neurological:     Mental Status: He is alert and oriented to person, place, and time.     Coordination: Coordination normal.     Vitals:   07/17/22 1103  BP: 100/80  Pulse: 77  Temp: 98.4 F (36.9 C)  TempSrc: Oral  SpO2: 99%  Weight: 195 lb (88.5 kg)  Height: 5\' 8"  (1.727 m)    Assessment & Plan:

## 2022-07-29 ENCOUNTER — Telehealth: Payer: BC Managed Care – PPO | Admitting: Physician Assistant

## 2022-07-29 DIAGNOSIS — L539 Erythematous condition, unspecified: Secondary | ICD-10-CM

## 2022-07-29 DIAGNOSIS — R609 Edema, unspecified: Secondary | ICD-10-CM | POA: Diagnosis not present

## 2022-07-29 DIAGNOSIS — S70361A Insect bite (nonvenomous), right thigh, initial encounter: Secondary | ICD-10-CM | POA: Diagnosis not present

## 2022-07-29 DIAGNOSIS — W57XXXA Bitten or stung by nonvenomous insect and other nonvenomous arthropods, initial encounter: Secondary | ICD-10-CM | POA: Diagnosis not present

## 2022-07-29 DIAGNOSIS — A6920 Lyme disease, unspecified: Secondary | ICD-10-CM

## 2022-07-29 MED ORDER — DOXYCYCLINE HYCLATE 100 MG PO TABS: 100.0000 mg | ORAL_TABLET | Freq: Two times a day (BID) | ORAL | 0 refills | Status: DC

## 2022-07-29 NOTE — Patient Instructions (Signed)
Trey Sailors, thank you for joining Margaretann Loveless, PA-C for today's virtual visit.  While this provider is not your primary care provider (PCP), if your PCP is located in our provider database this encounter information will be shared with them immediately following your visit.   A [facility] MyChart account gives you access to today's visit and all your visits, tests, and labs performed at [facility] " click here if you don't have a [facility] MyChart account or go to [email or web address]  Consent: (Patient) Dustin Warren provided verbal consent for this virtual visit at the beginning of the encounter.  Current Medications:  Current Outpatient Medications:    doxycycline (VIBRA-TABS) 100 MG tablet, Take 1 tablet (100 mg total) by mouth 2 (two) times daily., Disp: 42 tablet, Rfl: 0   hydrocortisone (ANUSOL-HC) 2.5 % rectal cream, Place 1 Application rectally 2 (two) times daily., Disp: 100 g, Rfl: 0   omeprazole (PRILOSEC) 40 MG capsule, Take 1 capsule (40 mg total) by mouth daily., Disp: 90 capsule, Rfl: 3   Medications ordered in this encounter:  Meds ordered this encounter  Medications   doxycycline (VIBRA-TABS) 100 MG tablet    Sig: Take 1 tablet (100 mg total) by mouth 2 (two) times daily.    Dispense:  42 tablet    Refill:  0    Order Specific Question:   Supervising Provider    Answer:   KIRKWOOD, NAVAS [id number]     *If you need refills on other medications prior to your next appointment, please contact your pharmacy*  Follow-Up: Call back or seek an in-person evaluation if the symptoms worsen or if the condition fails to improve as anticipated.  [facility] [phone]  Other Instructions  Tick Bite Information, Adult  Ticks are insects that draw blood for food. They climb onto people and animals that brush against the leaves and grasses that they live in. They then bite and attach to the skin. Most ticks are harmless, but  some ticks may carry germs that can cause disease. These germs are spread to a person through a bite. To lower your risk of getting a disease from a tick bite, make sure you: Take steps to prevent tick bites. Check for ticks after being outdoors where ticks live. Watch for symptoms of disease if a tick attached to you or if you think a tick bit you. How can I prevent tick bites? Take these steps to help prevent tick bites when you go outdoors in an area where ticks live: Before you go outdoors: Wear long sleeves and long pants to protect your skin from ticks. Wear light-colored clothing so you can see ticks easier. Tuck your pant legs into your socks. Apply insect repellent that has DEET (20% or higher), picaridin, or IR3535 in it to the following areas: Any bare skin. Avoid areas around the eyes and mouth. Edges of clothing, like the top of your boots, the bottom of your pant legs, and your sleeve cuffs. Consider applying an insect repellant that contains permethrin. Follow the instructions on the label. Do not apply permethrin directly to the skin. Instead, apply to the following areas: Clothing and shoes. Outdoor gear and tents. When you are outdoors: Avoid walking through areas with long grass. If you are walking on a trail, stay in the middle of the trail so your skin, hair, and clothing do not touch the bushes. Check for ticks on your clothing, hair, and skin often while you are  outdoors. Check again before you go inside. When you go indoors: Check your clothing for ticks. Tumble dry clothes in a dryer on high heat for at least 10 minutes. If clothes are damp, additional time may be needed. If clothes require washing, use hot water. Check your gear and pets. Shower soon after being outdoors. Check your body for ticks. Do a full body check using a mirror. Be sure to check your scalp, neck, armpits, waist, groin, and joint areas. These are the spots where ticks attach themselves most  often. What is the best way to remove a tick?  Remove the tick as soon as possible. Removing it can prevent germs from passing to your body. Do not remove the tick with your bare fingers. Do not try to remove a tick with heat, alcohol, petroleum jelly, or fingernail polish. These things can cause the tick to salivate and regurgitate into your bloodstream, increasing your risk of getting a disease. To remove a tick that is crawling on your skin: Go outside and brush the tick off. Use tape or a lint roller. To remove a tick that is attached to your skin: Wash your hands. If you have gloves, put them on. Use a fine-tipped tweezer, curved forceps, or a tick-removal tool to gently grasp the tick as close to your skin and the tick's head as possible. Gently pull with a steady, upward, and even pressure until the tick lets go. While removing the tick: Take care to keep the tick's head attached to its body. Do not twist or jerk the tick. This can make the tick's head or mouth parts break off and stay in your skin. If this happens, try to remove the mouth parts with tweezers. If you cannot remove them, leave the area alone and let the skin heal. Do not squeeze or crush the tick's body. This could force disease-carrying fluids from the tick into your body. What should I do after removing a tick? Clean the bite area and your hands with soap and water, rubbing alcohol, or an iodine scrub. If an antiseptic cream or ointment is available, put a small amount on the bite area. Wash and disinfect any tools that you used to remove the tick. How should I dispose of a tick? To dispose of a live tick, use one of these methods: Place it in rubbing alcohol. Place it in a sealed bag or container, and throw it away. Wrap it tightly in tape, and throw it away. Flush it down the toilet. Where to find more information Centers for Disease Control and Prevention: GyrateAtrophy.si [place]. Environmental Protection Agency:  RelocationNetworking.fi Contact a health care provider if: You have symptoms of a disease after a tick bite. Symptoms of a tick-borne disease can occur from moments after the tick bites to 30 days after a tick is removed. Symptoms include: Fever or chills. A red rash that makes a circle (bull's-eye rash) in the bite area. Redness and swelling in the bite area. Headache or stiff neck. Muscle, joint, or bone pain. Abnormal tiredness. Numbness in your legs or trouble walking or moving your legs. Tender or swollen lymph glands. Abdominal pain, vomiting, diarrhea, or weight loss. Get help right away if: You are not able to remove a tick. You have muscle weakness or paralysis. Your symptoms get worse or you experience new symptoms. You find an engorged tick on your skin and you are in an area where there is a higher risk of disease from ticks. Summary Ticks may  carry germs that can spread to a person through a bite. These germs can cause disease. Wear protective clothing and use insect repellent to prevent tick bites. Follow the instructions on the label. If you find a tick on your body, remove it as soon as possible. If the tick is attached, do not try to remove it with heat, alcohol, petroleum jelly, or fingernail polish. If you have symptoms of a disease after being bitten by a tick, contact a health care provider. This information is not intended to replace advice given to you by your health care provider. Make sure you discuss any questions you have with your health care provider. Document Revised: 06/04/2021 Document Reviewed: 06/04/2021 Elsevier Patient Education  2023 Elsevier Inc.    If you have been instructed to have an in-person evaluation today at a local Urgent Care facility, please use the link below. It will take you to a list of all of our available [facility], including address, phone number and hours of operation. Please do not delay care.  [facility]  If you or a family member do not have a primary care provider, use the link below to schedule a visit and establish care. When you choose a [facility] primary care physician or advanced practice provider, you gain a long-term partner in health. Find a Primary Care Provider  Learn more about [facility]'s in-office and virtual care options:  - Get Care Now

## 2022-07-29 NOTE — Progress Notes (Signed)
Virtual Visit Consent   Dustin Warren, you are scheduled for a virtual visit with a [facility] provider today. Just as with appointments in the office, your consent must be obtained to participate. Your consent will be active for this visit and any virtual visit you may have with one of our providers in the next 365 days. If you have a MyChart account, a copy of this consent can be sent to you electronically.  As this is a virtual visit, video technology does not allow for your provider to perform a traditional examination. This may limit your provider's ability to fully assess your condition. If your provider identifies any concerns that need to be evaluated in person or the need to arrange testing (such as labs, EKG, etc.), we will make arrangements to do so. Although advances in technology are sophisticated, we cannot ensure that it will always work on either your end or our end. If the connection with a video visit is poor, the visit may have to be switched to a telephone visit. With either a video or telephone visit, we are not always able to ensure that we have a secure connection.  By engaging in this virtual visit, you consent to the provision of healthcare and authorize for your insurance to be billed (if applicable) for the services provided during this visit. Depending on your insurance coverage, you may receive a charge related to this service.  I need to obtain your verbal consent now. Are you willing to proceed with your visit today? Dustin Warren has provided verbal consent on 07/29/2022 for a virtual visit (video or telephone). Margaretann Loveless, PA-C  Date: 07/29/2022 9:57 AM  Virtual Visit via Video Note   I, Margaretann Loveless, connected with  Dustin Warren  ([id number], [birth year]) on 07/29/22 at  8:15 AM EDT by a video-enabled telemedicine application and verified that I am speaking with the correct person using two identifiers.  Location: Patient: Virtual Visit Location  Patient: Mobile Provider: Virtual Visit Location Provider: Home Office   I discussed the limitations of evaluation and management by telemedicine and the availability of in person appointments. The patient expressed understanding and agreed to proceed.    History of Present Illness: Dustin Warren is a 37 y.o. who identifies as a male who was assigned male at birth, and is being seen today for tick bite. Was bit on the inner right thigh. Tick found Thursday, 07/25/22, and removed. Since has developed an erythematous area that is about the size of a dollar bill per patient with central clearing to yellowing starting right at site of bite. Denies fevers, chills, nausea, vomiting, joint pains, rash. Reports this is 3rd tick bite this season.   Problems:  Patient Active Problem List   Diagnosis Date Noted   Rectal discomfort 07/17/2022   Hepatic steatosis 03/06/2021   GERD (gastroesophageal reflux disease) 11/27/2020   Elevated ALT measurement 11/20/2017   Routine general medical examination at a health care facility 05/12/2014    Allergies: No Known Allergies Medications:  Current Outpatient Medications:    doxycycline (VIBRA-TABS) 100 MG tablet, Take 1 tablet (100 mg total) by mouth 2 (two) times daily., Disp: 42 tablet, Rfl: 0   hydrocortisone (ANUSOL-HC) 2.5 % rectal cream, Place 1 Application rectally 2 (two) times daily., Disp: 100 g, Rfl: 0   omeprazole (PRILOSEC) 40 MG capsule, Take 1 capsule (40 mg total) by mouth daily., Disp: 90 capsule, Rfl: 3  Observations/Objective: Patient is well-developed, well-nourished in no acute distress.  Resting comfortably  Head is normocephalic, atraumatic.  No labored breathing. Speech is clear and coherent with logical content.  Patient is alert and oriented at baseline.    Assessment and Plan: 1. Tick bite of right thigh, initial encounter - doxycycline (VIBRA-TABS) 100 MG tablet; Take 1 tablet (100 mg total) by mouth 2 (two) times daily.   Dispense: 42 tablet; Refill: 0  2. Redness - doxycycline (VIBRA-TABS) 100 MG tablet; Take 1 tablet (100 mg total) by mouth 2 (two) times daily.  Dispense: 42 tablet; Refill: 0  3. Swelling - doxycycline (VIBRA-TABS) 100 MG tablet; Take 1 tablet (100 mg total) by mouth 2 (two) times daily.  Dispense: 42 tablet; Refill: 0  4. Erythema migrans (Lyme disease)  - Doxycycline x 21 days for potential erythema migrans rash following recent tick bite - Monitor for worsening symptoms and if they develop to seek immediate medical attention in person - May follow up with PCP for official testing if desired  Follow Up Instructions: I discussed the assessment and treatment plan with the patient. The patient was provided an opportunity to ask questions and all were answered. The patient agreed with the plan and demonstrated an understanding of the instructions.  A copy of instructions were sent to the patient via MyChart unless otherwise noted below.    The patient was advised to call back or seek an in-person evaluation if the symptoms worsen or if the condition fails to improve as anticipated.  Time:  I spent 12 minutes with the patient via telehealth technology discussing the above problems/concerns.    Margaretann Loveless, PA-C

## 2022-09-03 DIAGNOSIS — K760 Fatty (change of) liver, not elsewhere classified: Secondary | ICD-10-CM | POA: Diagnosis not present

## 2022-09-03 DIAGNOSIS — R748 Abnormal levels of other serum enzymes: Secondary | ICD-10-CM | POA: Diagnosis not present

## 2022-10-12 ENCOUNTER — Other Ambulatory Visit: Payer: Self-pay | Admitting: Internal Medicine

## 2023-04-22 ENCOUNTER — Telehealth: Payer: Self-pay | Admitting: Internal Medicine

## 2023-04-22 NOTE — Telephone Encounter (Signed)
Likely does not need referral can call [facility] to get his colonoscopy. If referral needed okay to schedule physical can be done then

## 2023-04-22 NOTE — Telephone Encounter (Signed)
 Copied from CRM [id number]. Topic: Clinical - Request for Lab/Test Order >> Apr 22, 2023 10:25 AM Dustin Warren wrote: Reason for CRM: patient called wanting to know who he need to talk to in regards to getting a colonoscopy done. Patient stated he is on a five year plan  ---  Does pt need an OV before we send out a referral for this?

## 2023-05-06 ENCOUNTER — Encounter: Payer: Self-pay | Admitting: Gastroenterology

## 2023-06-17 ENCOUNTER — Ambulatory Visit (AMBULATORY_SURGERY_CENTER): Payer: BC Managed Care – PPO

## 2023-06-17 VITALS — Ht 68.0 in | Wt 194.0 lb

## 2023-06-17 DIAGNOSIS — Z860100 Personal history of colon polyps, unspecified: Secondary | ICD-10-CM

## 2023-06-17 MED ORDER — NA SULFATE-K SULFATE-MG SULF 17.5-3.13-1.6 GM/177ML PO SOLN: 1.0000 | Freq: Once | ORAL | 0 refills | Status: AC

## 2023-06-17 NOTE — Progress Notes (Signed)

## 2023-06-27 ENCOUNTER — Encounter: Payer: Self-pay | Admitting: Gastroenterology

## 2023-07-01 ENCOUNTER — Ambulatory Visit (AMBULATORY_SURGERY_CENTER): Payer: BC Managed Care – PPO | Admitting: Gastroenterology

## 2023-07-01 ENCOUNTER — Encounter: Payer: Self-pay | Admitting: Gastroenterology

## 2023-07-01 VITALS — BP 109/66 | HR 63 | Temp 98.0°F | Resp 16 | Ht 68.0 in | Wt 194.0 lb

## 2023-07-01 DIAGNOSIS — Z1211 Encounter for screening for malignant neoplasm of colon: Secondary | ICD-10-CM

## 2023-07-01 DIAGNOSIS — K641 Second degree hemorrhoids: Secondary | ICD-10-CM

## 2023-07-01 DIAGNOSIS — Z860101 Personal history of adenomatous and serrated colon polyps: Secondary | ICD-10-CM

## 2023-07-01 DIAGNOSIS — Z860100 Personal history of colon polyps, unspecified: Secondary | ICD-10-CM

## 2023-07-01 MED ORDER — SODIUM CHLORIDE 0.9 % IV SOLN: 500.0000 mL | Freq: Once | INTRAVENOUS | Status: DC

## 2023-07-01 NOTE — Progress Notes (Signed)
 GASTROENTEROLOGY PROCEDURE H&P NOTE   Primary Care Physician: Myrlene Broker, MD  HPI: Dustin Warren is a 38 y.o. male who presents for Colonoscopy for surveillance of previous adenomas.  Past Medical History:  Diagnosis Date   Allergy    Anemia    Asthma    as a child   GERD (gastroesophageal reflux disease)    Neuromuscular disorder (HCC)    muscle pain after tick bite    Past Surgical History:  Procedure Laterality Date   COLONOSCOPY     MYRINGOTOMY     POLYPECTOMY     TYMPANOSTOMY TUBE PLACEMENT     UPPER GASTROINTESTINAL ENDOSCOPY     WISDOM TOOTH EXTRACTION  03/2017   Current Outpatient Medications  Medication Sig Dispense Refill   hydrocortisone (ANUSOL-HC) 2.5 % rectal cream Place 1 Application rectally 2 (two) times daily. 100 g 0   omeprazole (PRILOSEC) 40 MG capsule TAKE 1 CAPSULE BY MOUTH DAILY 90 capsule 3   No current facility-administered medications for this visit.    Current Outpatient Medications:    hydrocortisone (ANUSOL-HC) 2.5 % rectal cream, Place 1 Application rectally 2 (two) times daily., Disp: 100 g, Rfl: 0   omeprazole (PRILOSEC) 40 MG capsule, TAKE 1 CAPSULE BY MOUTH DAILY, Disp: 90 capsule, Rfl: 3 No Known Allergies Family History  Problem Relation Age of Onset   Arthritis Mother        OA   Hyperlipidemia Mother    Hypertension Mother    Diabetes Mother    Breast cancer Maternal Grandmother        breast   Heart disease Maternal Grandfather    Arthritis Maternal Grandfather        rheumatoid   Colon cancer Maternal Grandfather    Stroke Paternal Grandfather    Arthritis Maternal Aunt    Arthritis Maternal Uncle    Cancer Paternal Grandmother 51       colon, oral   Esophageal cancer Paternal Grandmother    Stomach cancer Neg Hx    Pancreatic cancer Neg Hx    Rectal cancer Neg Hx    Colon polyps Neg Hx    Social History   Socioeconomic History   Marital status: Married    Spouse name: Not on file   Number of  children: Not on file   Years of education: Not on file   Highest education level: Not on file  Occupational History   Not on file  Tobacco Use   Smoking status: Former    Types: Cigarettes   Smokeless tobacco: Never   Tobacco comments:    1-2 cigarettes per day  Vaping Use   Vaping status: Never Used  Substance and Sexual Activity   Alcohol use: Yes    Alcohol/week: 0.0 standard drinks of alcohol    Comment: Occasional   Drug use: No   Sexual activity: Not on file  Other Topics Concern   Not on file  Social History Narrative   Not on file   Social Drivers of Health   Financial Resource Strain: Not on file  Food Insecurity: Not on file  Transportation Needs: Not on file  Physical Activity: Not on file  Stress: Not on file  Social Connections: Not on file  Intimate Partner Violence: Not on file    Physical Exam: There were no vitals filed for this visit. There is no height or weight on file to calculate BMI. GEN: NAD EYE: Sclerae anicteric ENT: MMM CV: Non-tachycardic GI: Soft, NT/ND  NEURO:  Alert & Oriented x 3  Lab Results: No results for input(s): "WBC", "HGB", "HCT", "PLT" in the last 72 hours. BMET No results for input(s): "NA", "K", "CL", "CO2", "GLUCOSE", "BUN", "CREATININE", "CALCIUM" in the last 72 hours. LFT No results for input(s): "PROT", "ALBUMIN", "AST", "ALT", "ALKPHOS", "BILITOT", "BILIDIR", "IBILI" in the last 72 hours. PT/INR No results for input(s): "LABPROT", "INR" in the last 72 hours.   Impression / Plan: This is a 38 y.o.male who presents for Colonoscopy for surveillance of previous adenomas.  The risks and benefits of endoscopic evaluation/treatment were discussed with the patient and/or family; these include but are not limited to the risk of perforation, infection, bleeding, missed lesions, lack of diagnosis, severe illness requiring hospitalization, as well as anesthesia and sedation related illnesses.  The patient's history has been  reviewed, patient examined, no change in status, and deemed stable for procedure.  The patient and/or family is agreeable to proceed.    [name], MD [facility] Advanced [facility]oscopy Office # [phone]

## 2023-07-01 NOTE — Progress Notes (Signed)
 Sedate, gd SR, tolerated procedure well, VSS, report to RN

## 2023-07-01 NOTE — Op Note (Signed)
 [facility] Patient Name: Dustin Warren Procedure Date: 07/01/2023 8:31 AM MRN: [id number] Endoscopist: Corliss Parish , MD, [id number] Age: 38 Referring MD:  Date of Birth: 1985-07-27 Gender: Male Account #: [id number] Procedure:                Colonoscopy Indications:              Surveillance: Personal history of adenomatous                            polyps on last colonoscopy 5 years ago Medicines:                Monitored Anesthesia Care Procedure:                Pre-Anesthesia Assessment:                           - Prior to the procedure, a History and Physical                            was performed, and patient medications and                            allergies were reviewed. The patient's tolerance of                            previous anesthesia was also reviewed. The risks                            and benefits of the procedure and the sedation                            options and risks were discussed with the patient.                            All questions were answered, and informed consent                            was obtained. Prior Anticoagulants: The patient has                            taken no anticoagulant or antiplatelet agents. ASA                            Grade Assessment: II - A patient with mild systemic                            disease. After reviewing the risks and benefits,                            the patient was deemed in satisfactory condition to                            undergo the procedure.  After obtaining informed consent, the colonoscope                            was passed under direct vision. Throughout the                            procedure, the patient's blood pressure, pulse, and                            oxygen saturations were monitored continuously. The                            CF [id number] #[id number] was introduced through the anus                            and advanced to the  the cecum, identified by                            appendiceal orifice and ileocecal valve. The                            colonoscopy was performed without difficulty. The                            patient tolerated the procedure. The quality of the                            bowel preparation was good. The ileocecal valve,                            appendiceal orifice, and rectum were photographed. Scope In: 8:37:05 AM Scope Out: 8:47:08 AM Scope Withdrawal Time: 0 hours 7 minutes 24 seconds  Total Procedure Duration: 0 hours 10 minutes 3 seconds  Findings:                 The digital rectal exam findings include                            hemorrhoids. Pertinent negatives include no                            palpable rectal lesions.                           Normal mucosa was found in the entire colon.                           Non-bleeding non-thrombosed external and internal                            hemorrhoids were found during retroflexion, during                            perianal exam and during digital exam. The  hemorrhoids were Grade II (internal hemorrhoids                            that prolapse but reduce spontaneously). Complications:            No immediate complications. Estimated Blood Loss:     Estimated blood loss: none. Impression:               - Hemorrhoids found on digital rectal exam.                           - Normal mucosa in the entire examined colon.                           - Non-bleeding non-thrombosed external and internal                            hemorrhoids. Recommendation:           - The patient will be observed post-procedure,                            until all discharge criteria are met.                           - Discharge patient to home.                           - Patient has a contact number available for                            emergencies. The signs and symptoms of potential                             delayed complications were discussed with the                            patient. Return to normal activities tomorrow.                            Written discharge instructions were provided to the                            patient.                           - High fiber diet.                           - Use FiberCon 1-2 tablets PO daily.                           - Continue present medications.                           - Repeat colonoscopy in 5 years for surveillance  due to early age of precancerous adenomas being                            found.                           - The findings and recommendations were discussed                            with the patient.                           - The findings and recommendations were discussed                            with the patient's family. [name], MD 07/01/2023 8:53:21 AM

## 2023-07-01 NOTE — Progress Notes (Signed)
 Pt's states no medical or surgical changes since previsit or office visit.

## 2023-07-01 NOTE — Patient Instructions (Addendum)
 Resume previous diet Resume previous medications High fiber diet Use FiberCon 1-2 tablets PO daily Repeat colonoscopy in 5 years   YOU HAD AN ENDOSCOPIC PROCEDURE TODAY AT THE [facility]:   Refer to the procedure report that was given to you for any specific questions about what was found during the examination.  If the procedure report does not answer your questions, please call your gastroenterologist to clarify.  If you requested that your care partner not be given the details of your procedure findings, then the procedure report has been included in a sealed envelope for you to review at your convenience later.  YOU SHOULD EXPECT: Some feelings of bloating in the abdomen. Passage of more gas than usual.  Walking can help get rid of the air that was put into your GI tract during the procedure and reduce the bloating. If you had a lower endoscopy (such as a colonoscopy or flexible sigmoidoscopy) you may notice spotting of blood in your stool or on the toilet paper. If you underwent a bowel prep for your procedure, you may not have a normal bowel movement for a few days.  Please Note:  You might notice some irritation and congestion in your nose or some drainage.  This is from the oxygen used during your procedure.  There is no need for concern and it should clear up in a day or so.  SYMPTOMS TO REPORT IMMEDIATELY:  Following lower endoscopy (colonoscopy or flexible sigmoidoscopy):  Excessive amounts of blood in the stool  Significant tenderness or worsening of abdominal pains  Swelling of the abdomen that is new, acute  Fever of 100F or higher  For urgent or emergent issues, a gastroenterologist can be reached at any hour by calling [phone]. Do not use MyChart messaging for urgent concerns.    DIET:  We do recommend a small meal at first, but then you may proceed to your regular diet.  Drink plenty of fluids but you should avoid alcoholic beverages for 24  hours.  ACTIVITY:  You should plan to take it easy for the rest of today and you should NOT DRIVE or use heavy machinery until tomorrow (because of the sedation medicines used during the test).    FOLLOW UP: Our staff will call the number listed on your records the next business day following your procedure.  We will call around 7:15- 8:00 am to check on you and address any questions or concerns that you may have regarding the information given to you following your procedure. If we do not reach you, we will leave a message.      SIGNATURES/CONFIDENTIALITY: You and/or your care partner have signed paperwork which will be entered into your electronic medical record.  These signatures attest to the fact that that the information above on your After Visit Summary has been reviewed and is understood.  Full responsibility of the confidentiality of this discharge information lies with you and/or your care-partner.

## 2023-07-02 ENCOUNTER — Telehealth: Payer: Self-pay

## 2023-07-02 NOTE — Telephone Encounter (Signed)
  Follow up Call-     07/01/2023    8:07 AM  Call back number  Post procedure Call Back phone  # [phone]  Permission to leave phone message Yes     Patient questions:  Do you have a fever, pain , or abdominal swelling? No. Pain Score  0 *  Have you tolerated food without any problems? Yes.    Have you been able to return to your normal activities? Yes.    Do you have any questions about your discharge instructions: Diet   No. Medications  No. Follow up visit  No.  Do you have questions or concerns about your Care? No.  Actions: * If pain score is 4 or above: No action needed, pain <4.

## 2023-09-04 ENCOUNTER — Other Ambulatory Visit: Payer: Self-pay | Admitting: Nurse Practitioner

## 2023-09-04 DIAGNOSIS — K760 Fatty (change of) liver, not elsewhere classified: Secondary | ICD-10-CM | POA: Diagnosis not present

## 2023-09-04 DIAGNOSIS — E6609 Other obesity due to excess calories: Secondary | ICD-10-CM | POA: Diagnosis not present

## 2023-09-09 ENCOUNTER — Ambulatory Visit
Admission: RE | Admit: 2023-09-09 | Discharge: 2023-09-09 | Disposition: A | Discharge status: Home / Self Care | Source: Ambulatory Visit | Attending: Nurse Practitioner | Admitting: Nurse Practitioner

## 2023-09-09 DIAGNOSIS — E6609 Other obesity due to excess calories: Secondary | ICD-10-CM

## 2023-09-09 DIAGNOSIS — R945 Abnormal results of liver function studies: Secondary | ICD-10-CM | POA: Diagnosis not present

## 2023-09-09 DIAGNOSIS — K760 Fatty (change of) liver, not elsewhere classified: Secondary | ICD-10-CM

## 2023-10-04 ENCOUNTER — Encounter: Payer: Self-pay | Admitting: Internal Medicine

## 2023-10-13 ENCOUNTER — Other Ambulatory Visit: Payer: Self-pay | Admitting: Internal Medicine

## 2023-11-03 ENCOUNTER — Ambulatory Visit (INDEPENDENT_AMBULATORY_CARE_PROVIDER_SITE_OTHER): Admitting: Internal Medicine

## 2023-11-03 ENCOUNTER — Encounter: Payer: Self-pay | Admitting: Internal Medicine

## 2023-11-03 VITALS — BP 116/80 | HR 67 | Temp 98.5°F | Ht 68.0 in | Wt 203.0 lb

## 2023-11-03 DIAGNOSIS — Z0000 Encounter for general adult medical examination without abnormal findings: Secondary | ICD-10-CM | POA: Diagnosis not present

## 2023-11-03 DIAGNOSIS — K219 Gastro-esophageal reflux disease without esophagitis: Secondary | ICD-10-CM

## 2023-11-03 DIAGNOSIS — R7401 Elevation of levels of liver transaminase levels: Secondary | ICD-10-CM | POA: Diagnosis not present

## 2023-11-03 DIAGNOSIS — E785 Hyperlipidemia, unspecified: Secondary | ICD-10-CM | POA: Insufficient documentation

## 2023-11-03 DIAGNOSIS — K760 Fatty (change of) liver, not elsewhere classified: Secondary | ICD-10-CM

## 2023-11-03 DIAGNOSIS — E782 Mixed hyperlipidemia: Secondary | ICD-10-CM

## 2023-11-03 MED ORDER — OMEPRAZOLE 40 MG PO CPDR: 40.0000 mg | DELAYED_RELEASE_CAPSULE | Freq: Every day | ORAL | 3 refills | Status: AC

## 2023-11-03 NOTE — Assessment & Plan Note (Signed)
 Seeing hepatology and past fibroscan without fibrosis. Levels reviewed and stable. Encouraged increased exercise to help and diet.

## 2023-11-03 NOTE — Assessment & Plan Note (Signed)
 Flu shot yearly. Tetanus up to date. Colonoscopy up to date. Counseled about sun safety and mole surveillance. Counseled about the dangers of distracted driving. Given 10 year screening recommendations.

## 2023-11-03 NOTE — Assessment & Plan Note (Signed)
 LDL 110 and goal <100 in fatty liver disease which he has. We discussed lifestyle modifications including diet and exercise and recheck lipids 6-12 months.

## 2023-11-03 NOTE — Assessment & Plan Note (Signed)
 Counseled about BP and lipids. He will work on adding exercise to help with lipids.

## 2023-11-03 NOTE — Assessment & Plan Note (Signed)
 Taking omeprazole  40 mg daily which is refilled and continue. Past ulcer.

## 2023-11-03 NOTE — Progress Notes (Signed)
   Subjective:   Patient ID: Dustin Warren, male    DOB: [birth year], 38 y.o.   MRN: [id number]  The patient is here for physical. Pertinent topics discussed: Discussed the use of AI scribe software for clinical note transcription with the patient, who gave verbal consent to proceed.  History of Present Illness Dustin Warren is a 38 year old male who presents for a routine follow-up.  He has a history of fatty liver disease with stable, slightly elevated liver enzymes that have improved over time. He undergoes regular monitoring with ultrasounds and FibroScans, alternating each year. He attributes a past increase in liver inflammation to a series of tick bites several years ago.   He mentions a family history of genetic testing revealing his mother has markers for a cancer-related gene, possibly BRCA2 he is unsure and will check which could be passed to him and his children.   PMH, [facility], social history reviewed and updated  Review of Systems  Constitutional: Negative.   HENT: Negative.    Eyes: Negative.   Respiratory:  Negative for cough, chest tightness and shortness of breath.   Cardiovascular:  Negative for chest pain, palpitations and leg swelling.  Gastrointestinal:  Negative for abdominal distention, abdominal pain, constipation, diarrhea, nausea and vomiting.  Musculoskeletal: Negative.   Skin: Negative.   Neurological: Negative.   Psychiatric/Behavioral: Negative.      Objective:  Physical Exam Constitutional:      Appearance: He is well-developed.  HENT:     Head: Normocephalic and atraumatic.  Cardiovascular:     Rate and Rhythm: Normal rate and regular rhythm.  Pulmonary:     Effort: Pulmonary effort is normal. No respiratory distress.     Breath sounds: Normal breath sounds. No wheezing or rales.  Abdominal:     General: Bowel sounds are normal. There is no distension.     Palpations: Abdomen is soft.     Tenderness: There is no abdominal tenderness. There is no  rebound.  Musculoskeletal:     Cervical back: Normal range of motion.  Skin:    General: Skin is warm and dry.  Neurological:     Mental Status: He is alert and oriented to person, place, and time.     Coordination: Coordination normal.     Vitals:   11/03/23 0803  BP: 116/80  Pulse: 67  Temp: 98.5 F (36.9 C)  TempSrc: Oral  SpO2: 99%  Weight: 203 lb (92.1 kg)  Height: 5' 8 (1.727 m)    Assessment & Plan:
